# Patient Record
Sex: Female | Born: 1990 | Race: White | Hispanic: No | Marital: Single | Smoking: Never smoker
Health system: Southern US, Community
[De-identification: ages and names within clinical notes are randomized; demographics above are authoritative.]

## PROBLEM LIST (undated history)

## (undated) DIAGNOSIS — Z3483 Encounter for supervision of other normal pregnancy, third trimester: Secondary | ICD-10-CM

## (undated) DIAGNOSIS — Z9851 Tubal ligation status: Secondary | ICD-10-CM

## (undated) DIAGNOSIS — E079 Disorder of thyroid, unspecified: Secondary | ICD-10-CM

## (undated) DIAGNOSIS — E039 Hypothyroidism, unspecified: Secondary | ICD-10-CM

## (undated) DIAGNOSIS — Z3400 Encounter for supervision of normal first pregnancy, unspecified trimester: Secondary | ICD-10-CM

## (undated) HISTORY — DX: Hypothyroidism, unspecified: E03.9

## (undated) HISTORY — PX: NO PAST SURGERIES: SHX2092

## (undated) HISTORY — PX: TUBAL LIGATION: SHX77

## (undated) HISTORY — DX: Disorder of thyroid, unspecified: E07.9

---
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
## 2011-03-07 ENCOUNTER — Encounter: Payer: Self-pay | Admitting: Obstetrics and Gynecology

## 2011-03-10 ENCOUNTER — Other Ambulatory Visit (HOSPITAL_COMMUNITY): Payer: Self-pay | Admitting: Obstetrics and Gynecology

## 2011-03-10 DIAGNOSIS — Z3689 Encounter for other specified antenatal screening: Secondary | ICD-10-CM

## 2011-03-10 DIAGNOSIS — O2690 Pregnancy related conditions, unspecified, unspecified trimester: Secondary | ICD-10-CM

## 2011-03-14 ENCOUNTER — Ambulatory Visit (HOSPITAL_COMMUNITY)
Admission: RE | Admit: 2011-03-14 | Discharge: 2011-03-14 | Disposition: A | Discharge status: Home / Self Care | Payer: BC Managed Care – PPO | Source: Ambulatory Visit | Attending: Obstetrics and Gynecology | Admitting: Obstetrics and Gynecology

## 2011-03-14 ENCOUNTER — Encounter (HOSPITAL_COMMUNITY): Payer: Self-pay

## 2011-03-14 DIAGNOSIS — O4100X0 Oligohydramnios, unspecified trimester, not applicable or unspecified: Secondary | ICD-10-CM | POA: Insufficient documentation

## 2011-03-14 DIAGNOSIS — Z3689 Encounter for other specified antenatal screening: Secondary | ICD-10-CM

## 2011-03-14 DIAGNOSIS — O2690 Pregnancy related conditions, unspecified, unspecified trimester: Secondary | ICD-10-CM

## 2011-03-14 DIAGNOSIS — O358XX0 Maternal care for other (suspected) fetal abnormality and damage, not applicable or unspecified: Secondary | ICD-10-CM | POA: Insufficient documentation

## 2011-03-14 DIAGNOSIS — Z1389 Encounter for screening for other disorder: Secondary | ICD-10-CM | POA: Insufficient documentation

## 2011-03-14 DIAGNOSIS — Z363 Encounter for antenatal screening for malformations: Secondary | ICD-10-CM | POA: Insufficient documentation

## 2011-03-14 NOTE — Progress Notes (Signed)
Obstetric ultrasound completed today.  Please see report in [facility]. 

## 2011-03-28 ENCOUNTER — Ambulatory Visit (HOSPITAL_COMMUNITY)
Admission: RE | Admit: 2011-03-28 | Discharge: 2011-03-28 | Disposition: A | Discharge status: Home / Self Care | Payer: BC Managed Care – PPO | Source: Ambulatory Visit | Attending: Obstetrics and Gynecology | Admitting: Obstetrics and Gynecology

## 2011-03-28 DIAGNOSIS — O358XX0 Maternal care for other (suspected) fetal abnormality and damage, not applicable or unspecified: Secondary | ICD-10-CM | POA: Insufficient documentation

## 2011-03-28 DIAGNOSIS — O4100X0 Oligohydramnios, unspecified trimester, not applicable or unspecified: Secondary | ICD-10-CM | POA: Insufficient documentation

## 2011-03-28 DIAGNOSIS — O2690 Pregnancy related conditions, unspecified, unspecified trimester: Secondary | ICD-10-CM

## 2011-03-28 NOTE — Progress Notes (Signed)
Ultrasound in [facility].  Follow up U/S scheduled 

## 2011-04-18 ENCOUNTER — Other Ambulatory Visit (HOSPITAL_COMMUNITY): Payer: Self-pay | Admitting: Obstetrics and Gynecology

## 2011-04-18 ENCOUNTER — Ambulatory Visit (HOSPITAL_COMMUNITY)
Admission: RE | Admit: 2011-04-18 | Discharge: 2011-04-18 | Disposition: A | Discharge status: Home / Self Care | Payer: BC Managed Care – PPO | Source: Ambulatory Visit | Attending: Obstetrics and Gynecology | Admitting: Obstetrics and Gynecology

## 2011-04-18 DIAGNOSIS — O2690 Pregnancy related conditions, unspecified, unspecified trimester: Secondary | ICD-10-CM

## 2011-04-18 DIAGNOSIS — O4100X0 Oligohydramnios, unspecified trimester, not applicable or unspecified: Secondary | ICD-10-CM | POA: Insufficient documentation

## 2011-04-18 DIAGNOSIS — Z3689 Encounter for other specified antenatal screening: Secondary | ICD-10-CM | POA: Insufficient documentation

## 2011-04-23 LAB — US OB DETAIL + 14 WK

## 2011-05-09 ENCOUNTER — Ambulatory Visit (HOSPITAL_COMMUNITY)
Admission: RE | Admit: 2011-05-09 | Discharge: 2011-05-09 | Disposition: A | Discharge status: Home / Self Care | Payer: BC Managed Care – PPO | Source: Ambulatory Visit | Attending: Obstetrics and Gynecology | Admitting: Obstetrics and Gynecology

## 2011-05-09 ENCOUNTER — Other Ambulatory Visit (HOSPITAL_COMMUNITY): Payer: Self-pay | Admitting: Maternal and Fetal Medicine

## 2011-05-09 DIAGNOSIS — O4100X0 Oligohydramnios, unspecified trimester, not applicable or unspecified: Secondary | ICD-10-CM | POA: Insufficient documentation

## 2011-05-09 DIAGNOSIS — O2690 Pregnancy related conditions, unspecified, unspecified trimester: Secondary | ICD-10-CM

## 2011-05-09 DIAGNOSIS — O365990 Maternal care for other known or suspected poor fetal growth, unspecified trimester, not applicable or unspecified: Secondary | ICD-10-CM | POA: Insufficient documentation

## 2011-05-20 NOTE — L&D Delivery Note (Signed)
Delivery Note At 10:37 PM a non-viable female was delivered via Vaginal, Spontaneous Delivery (Presentation: Right Occiput Anterior).  APGAR:8 9, ; weight 6#13 .   Placenta status: Intact, Spontaneous.  Cord: 3 vessels with the following complications: None.   Anesthesia: Epidural  Episiotomy: None Lacerations: 1st degree Perineal;Periurethral Suture Repair: 3.0 vicryl rapide Est. Blood Loss (mL): 500cc  Mom to postpartum.  Baby to nursery-stable.  BOVARD,Christine Mercer 07/25/2011, 10:59 PM  B neg, RI, Br, Contra?

## 2011-05-29 ENCOUNTER — Ambulatory Visit (HOSPITAL_COMMUNITY)
Admission: RE | Admit: 2011-05-29 | Discharge: 2011-05-29 | Disposition: A | Discharge status: Home / Self Care | Payer: BC Managed Care – PPO | Source: Ambulatory Visit | Attending: Obstetrics and Gynecology | Admitting: Obstetrics and Gynecology

## 2011-05-29 DIAGNOSIS — O365990 Maternal care for other known or suspected poor fetal growth, unspecified trimester, not applicable or unspecified: Secondary | ICD-10-CM | POA: Insufficient documentation

## 2011-05-29 DIAGNOSIS — O358XX0 Maternal care for other (suspected) fetal abnormality and damage, not applicable or unspecified: Secondary | ICD-10-CM

## 2011-05-29 DIAGNOSIS — O2690 Pregnancy related conditions, unspecified, unspecified trimester: Secondary | ICD-10-CM

## 2011-05-29 DIAGNOSIS — O4100X0 Oligohydramnios, unspecified trimester, not applicable or unspecified: Secondary | ICD-10-CM | POA: Insufficient documentation

## 2011-05-29 NOTE — Progress Notes (Signed)
Ms. Santarelli was seen for ultrasound appointment today.  Please see [facility] report for details.

## 2011-06-26 ENCOUNTER — Other Ambulatory Visit (HOSPITAL_COMMUNITY): Payer: Self-pay | Admitting: Maternal and Fetal Medicine

## 2011-06-26 ENCOUNTER — Ambulatory Visit (HOSPITAL_COMMUNITY)
Admission: RE | Admit: 2011-06-26 | Discharge: 2011-06-26 | Disposition: A | Discharge status: Home / Self Care | Payer: BC Managed Care – PPO | Source: Ambulatory Visit | Attending: Obstetrics and Gynecology | Admitting: Obstetrics and Gynecology

## 2011-06-26 DIAGNOSIS — Z3689 Encounter for other specified antenatal screening: Secondary | ICD-10-CM | POA: Insufficient documentation

## 2011-06-26 DIAGNOSIS — IMO0002 Reserved for concepts with insufficient information to code with codable children: Secondary | ICD-10-CM

## 2011-06-26 DIAGNOSIS — O358XX0 Maternal care for other (suspected) fetal abnormality and damage, not applicable or unspecified: Secondary | ICD-10-CM

## 2011-06-26 DIAGNOSIS — O4100X0 Oligohydramnios, unspecified trimester, not applicable or unspecified: Secondary | ICD-10-CM | POA: Insufficient documentation

## 2011-07-10 ENCOUNTER — Ambulatory Visit (HOSPITAL_COMMUNITY)
Admission: RE | Admit: 2011-07-10 | Discharge: 2011-07-10 | Disposition: A | Discharge status: Home / Self Care | Payer: BC Managed Care – PPO | Source: Ambulatory Visit | Attending: Obstetrics and Gynecology | Admitting: Obstetrics and Gynecology

## 2011-07-10 DIAGNOSIS — IMO0002 Reserved for concepts with insufficient information to code with codable children: Secondary | ICD-10-CM

## 2011-07-10 DIAGNOSIS — O4100X0 Oligohydramnios, unspecified trimester, not applicable or unspecified: Secondary | ICD-10-CM | POA: Insufficient documentation

## 2011-07-10 NOTE — Progress Notes (Signed)
Obstetric ultrasound performed today.  Please see report in [facility].  Single living intrauterine pregnancy at 37 weeks 3 days. Appropriate fetal growth. Normal amniotic fluid volume. Reassuring interval fetal anatomy.

## 2011-07-17 ENCOUNTER — Ambulatory Visit (HOSPITAL_COMMUNITY)
Admission: RE | Admit: 2011-07-17 | Discharge: 2011-07-17 | Disposition: A | Discharge status: Home / Self Care | Payer: BC Managed Care – PPO | Source: Ambulatory Visit | Attending: Obstetrics and Gynecology | Admitting: Obstetrics and Gynecology

## 2011-07-17 DIAGNOSIS — O4100X0 Oligohydramnios, unspecified trimester, not applicable or unspecified: Secondary | ICD-10-CM

## 2011-07-17 DIAGNOSIS — Z3689 Encounter for other specified antenatal screening: Secondary | ICD-10-CM | POA: Insufficient documentation

## 2011-07-25 ENCOUNTER — Encounter (HOSPITAL_COMMUNITY): Payer: Self-pay | Admitting: Anesthesiology

## 2011-07-25 ENCOUNTER — Inpatient Hospital Stay (HOSPITAL_COMMUNITY): Payer: BC Managed Care – PPO | Admitting: Anesthesiology

## 2011-07-25 ENCOUNTER — Inpatient Hospital Stay (HOSPITAL_COMMUNITY)
Admission: AD | Admit: 2011-07-25 | Discharge: 2011-07-27 | DRG: 373 | Disposition: A | Discharge status: Home / Self Care | Payer: BC Managed Care – PPO | Source: Ambulatory Visit | Attending: Obstetrics and Gynecology | Admitting: Obstetrics and Gynecology

## 2011-07-25 ENCOUNTER — Encounter (HOSPITAL_COMMUNITY): Payer: Self-pay | Admitting: *Deleted

## 2011-07-25 DIAGNOSIS — Z22330 Carrier of Group B streptococcus: Secondary | ICD-10-CM

## 2011-07-25 DIAGNOSIS — O358XX0 Maternal care for other (suspected) fetal abnormality and damage, not applicable or unspecified: Secondary | ICD-10-CM

## 2011-07-25 DIAGNOSIS — O99892 Other specified diseases and conditions complicating childbirth: Secondary | ICD-10-CM | POA: Diagnosis present

## 2011-07-25 DIAGNOSIS — Z3400 Encounter for supervision of normal first pregnancy, unspecified trimester: Secondary | ICD-10-CM

## 2011-07-25 HISTORY — DX: Encounter for supervision of normal first pregnancy, unspecified trimester: Z34.00

## 2011-07-25 LAB — CBC
MCH: 28.8 pg (ref 26.0–34.0)
MCHC: 33 g/dL (ref 30.0–36.0)
Platelets: 257 10*3/uL (ref 150–400)

## 2011-07-25 LAB — RPR
RPR Ser Ql: NONREACTIVE
RPR: NONREACTIVE

## 2011-07-25 LAB — HIV ANTIBODY (ROUTINE TESTING W REFLEX): HIV: NONREACTIVE

## 2011-07-25 LAB — GC/CHLAMYDIA PROBE AMP, GENITAL: Gonorrhea: NEGATIVE

## 2011-07-25 LAB — ANTIBODY SCREEN: Antibody Screen: NEGATIVE

## 2011-07-25 MED ORDER — ONDANSETRON HCL 4 MG/2ML IJ SOLN: 4.0000 mg | Freq: Four times a day (QID) | INTRAMUSCULAR | Status: DC | PRN

## 2011-07-25 MED ORDER — ACETAMINOPHEN 325 MG PO TABS: 650.0000 mg | ORAL_TABLET | ORAL | Status: DC | PRN

## 2011-07-25 MED ORDER — EPHEDRINE 5 MG/ML INJ
INTRAVENOUS | Status: AC
  Filled 2011-07-25: qty 4

## 2011-07-25 MED ORDER — PHENYLEPHRINE 40 MCG/ML (10ML) SYRINGE FOR IV PUSH (FOR BLOOD PRESSURE SUPPORT): 80.0000 ug | PREFILLED_SYRINGE | INTRAVENOUS | Status: DC | PRN

## 2011-07-25 MED ORDER — EPHEDRINE 5 MG/ML INJ: 10.0000 mg | INTRAVENOUS | Status: DC | PRN

## 2011-07-25 MED ORDER — OXYTOCIN 20 UNITS IN LACTATED RINGERS INFUSION - SIMPLE: 1.0000 m[IU]/min | INTRAVENOUS | Status: DC

## 2011-07-25 MED ORDER — PHENYLEPHRINE 40 MCG/ML (10ML) SYRINGE FOR IV PUSH (FOR BLOOD PRESSURE SUPPORT)
PREFILLED_SYRINGE | INTRAVENOUS | Status: AC
  Filled 2011-07-25: qty 5

## 2011-07-25 MED ORDER — FENTANYL 2.5 MCG/ML BUPIVACAINE 1/10 % EPIDURAL INFUSION (WH - ANES)
14.0000 mL/h | INTRAMUSCULAR | Status: DC
  Administered 2011-07-25: 14 mL/h via EPIDURAL
  Filled 2011-07-25 (×2): qty 60

## 2011-07-25 MED ORDER — CITRIC ACID-SODIUM CITRATE 334-500 MG/5ML PO SOLN: 30.0000 mL | ORAL | Status: DC | PRN

## 2011-07-25 MED ORDER — FLEET ENEMA 7-19 GM/118ML RE ENEM: 1.0000 | ENEMA | RECTAL | Status: DC | PRN

## 2011-07-25 MED ORDER — PENICILLIN G POTASSIUM 5000000 UNITS IJ SOLR
2.5000 10*6.[IU] | INTRAVENOUS | Status: DC
  Filled 2011-07-25 (×4): qty 2.5

## 2011-07-25 MED ORDER — FENTANYL 2.5 MCG/ML BUPIVACAINE 1/10 % EPIDURAL INFUSION (WH - ANES)
INTRAMUSCULAR | Status: AC
  Filled 2011-07-25: qty 60

## 2011-07-25 MED ORDER — BUTORPHANOL TARTRATE 2 MG/ML IJ SOLN: 2.0000 mg | Freq: Two times a day (BID) | INTRAMUSCULAR | Status: DC | PRN

## 2011-07-25 MED ORDER — SODIUM CHLORIDE 0.9 % IV SOLN
2.0000 g | Freq: Once | INTRAVENOUS | Status: AC
  Administered 2011-07-25: 2 g via INTRAVENOUS
  Filled 2011-07-25: qty 2000

## 2011-07-25 MED ORDER — LACTATED RINGERS IV SOLN
INTRAVENOUS | Status: DC
  Administered 2011-07-25 (×2): via INTRAVENOUS

## 2011-07-25 MED ORDER — DIPHENHYDRAMINE HCL 50 MG/ML IJ SOLN: 12.5000 mg | INTRAMUSCULAR | Status: DC | PRN

## 2011-07-25 MED ORDER — LACTATED RINGERS IV SOLN
500.0000 mL | INTRAVENOUS | Status: DC | PRN
  Administered 2011-07-25: 500 mL via INTRAVENOUS

## 2011-07-25 MED ORDER — LACTATED RINGERS IV SOLN: 500.0000 mL | Freq: Once | INTRAVENOUS | Status: DC

## 2011-07-25 MED ORDER — LIDOCAINE HCL (PF) 1 % IJ SOLN
30.0000 mL | INTRAMUSCULAR | Status: DC | PRN
  Filled 2011-07-25: qty 30

## 2011-07-25 MED ORDER — FENTANYL 2.5 MCG/ML BUPIVACAINE 1/10 % EPIDURAL INFUSION (WH - ANES)
INTRAMUSCULAR | Status: DC | PRN
  Administered 2011-07-25: 14 mL/h via EPIDURAL

## 2011-07-25 MED ORDER — OXYTOCIN BOLUS FROM INFUSION
500.0000 mL | Freq: Once | INTRAVENOUS | Status: DC
  Filled 2011-07-25: qty 500
  Filled 2011-07-25: qty 1000

## 2011-07-25 MED ORDER — EPHEDRINE 5 MG/ML INJ
10.0000 mg | INTRAVENOUS | Status: AC | PRN
  Administered 2011-07-25 (×2): 10 mg via INTRAVENOUS

## 2011-07-25 MED ORDER — OXYCODONE-ACETAMINOPHEN 5-325 MG PO TABS: 1.0000 | ORAL_TABLET | ORAL | Status: DC | PRN

## 2011-07-25 MED ORDER — IBUPROFEN 600 MG PO TABS
600.0000 mg | ORAL_TABLET | Freq: Four times a day (QID) | ORAL | Status: DC | PRN
  Administered 2011-07-25: 600 mg via ORAL
  Filled 2011-07-25: qty 1

## 2011-07-25 MED ORDER — SODIUM BICARBONATE 8.4 % IV SOLN
INTRAVENOUS | Status: DC | PRN
  Administered 2011-07-25: 4 mL via EPIDURAL

## 2011-07-25 MED ORDER — PENICILLIN G POTASSIUM 5000000 UNITS IJ SOLR
2.5000 10*6.[IU] | INTRAVENOUS | Status: DC
  Administered 2011-07-25 (×2): 2.5 10*6.[IU] via INTRAVENOUS
  Filled 2011-07-25 (×4): qty 2.5

## 2011-07-25 MED ORDER — OXYTOCIN 20 UNITS IN LACTATED RINGERS INFUSION - SIMPLE: 125.0000 mL/h | Freq: Once | INTRAVENOUS | Status: DC

## 2011-07-25 MED ORDER — TERBUTALINE SULFATE 1 MG/ML IJ SOLN: 0.2500 mg | Freq: Once | INTRAMUSCULAR | Status: AC | PRN

## 2011-07-25 NOTE — Anesthesia Preprocedure Evaluation (Signed)

## 2011-07-25 NOTE — H&P (Signed)
Christine Mercer is a 21 y.o. female presenting for onset of labor.  SVE in office 5cm.  Pregnancy complicated by low AFI throughout, followed by MFM and [place].  Pt declined genetic screening.  Also GBBS.  +FM, no LOF, no VB, ctx increasing in intensity and frequency.  . Maternal Medical History:  Reason for admission: Reason for admission: contractions.  Contractions: Onset was yesterday.   Frequency: regular.   Perceived severity is moderate.    Fetal activity: Perceived fetal activity is normal.      OB History    Grav Para Term Preterm Abortions TAB SAB Ect Mult Living   1 0 0 0 0 0 0 0 0 0     G1 present, no pap, no STDs Past Medical History  Diagnosis Date  . No pertinent past medical history   . Normal pregnancy, first 07/25/2011   Past Surgical History  Procedure Date  . No past surgeries    Family History: DM, HTN Social History:  reports that she has never smoked. She does not have any smokeless tobacco history on file. She reports that she does not drink alcohol or use illicit drugs.in college  Review of Systems  Constitutional: Negative.   HENT: Negative.   Eyes: Negative.   Respiratory: Negative.   Cardiovascular: Negative.   Gastrointestinal: Negative.   Genitourinary: Negative.   Musculoskeletal: Negative.   Skin: Negative.   Neurological: Negative.   Psychiatric/Behavioral: Negative.       Blood pressure 125/72, pulse 104, last menstrual period 09/20/2010. Maternal Exam:  Abdomen: Patient reports no abdominal tenderness. Fundal height is small for dates.   Estimated fetal weight is 6.   Fetal presentation: vertex     Physical Exam  Constitutional: She is oriented to person, place, and time. She appears well-developed and well-nourished.  HENT:  Head: Normocephalic and atraumatic.  Eyes: Pupils are equal, round, and reactive to light.  Neck: Normal range of motion. Neck supple. No thyromegaly present.  Cardiovascular: Normal rate and regular rhythm.     Respiratory: Effort normal and breath sounds normal.  GI: Soft. Bowel sounds are normal.  Musculoskeletal: Normal range of motion.  Neurological: She is alert and oriented to person, place, and time.  Skin: Skin is warm and dry.  Psychiatric: She has a normal mood and affect. Her behavior is normal.    Prenatal labs: ABO, Rh: B/Negative/-- (03/08 0000) Antibody: Negative (03/08 0000) Rubella:  Immune RPR:   NR HBsAg: Negative (03/08 0000)  HIV: Non-reactive (03/08 0000)  GBS: Positive (03/08 0000)  Hgb13.2, Plt 267K, Rhogam 12/18, genetic screening declined, gc neg, chl neg  Dated by 8 week [place] Anat WNL, low AFI, followed by MFM, posterior placenta Assessment/Plan: 20yo G1P0 at 39+ with active labor, expect SVD, epidural prn, AROM after PCN, PCN for prophylaxis, pitocin prn   BOVARD,Emile Ringgenberg 07/25/2011, 1:48 PM

## 2011-07-25 NOTE — Progress Notes (Signed)
Christine Mercer is a 21 y.o. G1P0000 at [redacted]w[redacted]d admitted for active labor  Subjective: comf with epidural  Objective: BP 118/61  Pulse 114  Temp(Src) 97.5 F (36.4 C) (Oral)  Resp 20  Ht 5\' 5"  (1.651 m)  Wt 81.647 kg (180 lb)  BMI 29.95 kg/m2  SpO2 98%  LMP 09/20/2010      FHT:  FHR: 140's bpm, variability: moderate,  accelerations:  Present,  decelerations:  Absent UC:   regular, every 2-3 minutes SVE:   Dilation: 7.5 Effacement (%): 90 Station: 0 Exam by:: Dr Ellyn Hack  Labs: Lab Results  Component Value Date   WBC 17.5* 07/25/2011   HGB 12.4 07/25/2011   HCT 37.6 07/25/2011   MCV 87.4 07/25/2011   PLT 257 07/25/2011    Assessment / Plan: Spontaneous labor, progressing normally  Labor: Progressing normally Preeclampsia:  no signs or symptoms of toxicity Fetal Wellbeing:  Category I Pain Control:  Epidural I/D:  n/a Anticipated MOD:  NSVD  BOVARD,Adelei Scobey 07/25/2011, 6:43 PM

## 2011-07-25 NOTE — Anesthesia Procedure Notes (Signed)

## 2011-07-26 LAB — CBC
Hemoglobin: 9.9 g/dL — ABNORMAL LOW (ref 12.0–15.0)
Platelets: 263 10*3/uL (ref 150–400)
RBC: 3.46 MIL/uL — ABNORMAL LOW (ref 3.87–5.11)

## 2011-07-26 LAB — ABO/RH: ABO/RH(D): B NEG

## 2011-07-26 MED ORDER — PRENATAL MULTIVITAMIN CH
1.0000 | ORAL_TABLET | Freq: Every day | ORAL | Status: DC
  Administered 2011-07-26 – 2011-07-27 (×2): 1 via ORAL
  Filled 2011-07-26: qty 1

## 2011-07-26 MED ORDER — WITCH HAZEL-GLYCERIN EX PADS: 1.0000 "application " | MEDICATED_PAD | CUTANEOUS | Status: DC | PRN

## 2011-07-26 MED ORDER — DIPHENHYDRAMINE HCL 25 MG PO CAPS: 25.0000 mg | ORAL_CAPSULE | Freq: Four times a day (QID) | ORAL | Status: DC | PRN

## 2011-07-26 MED ORDER — DIBUCAINE 1 % RE OINT: 1.0000 "application " | TOPICAL_OINTMENT | RECTAL | Status: DC | PRN

## 2011-07-26 MED ORDER — LANOLIN HYDROUS EX OINT: TOPICAL_OINTMENT | CUTANEOUS | Status: DC | PRN

## 2011-07-26 MED ORDER — SIMETHICONE 80 MG PO CHEW: 80.0000 mg | CHEWABLE_TABLET | ORAL | Status: DC | PRN

## 2011-07-26 MED ORDER — OXYCODONE-ACETAMINOPHEN 5-325 MG PO TABS
1.0000 | ORAL_TABLET | ORAL | Status: DC | PRN
  Administered 2011-07-26 – 2011-07-27 (×4): 1 via ORAL
  Filled 2011-07-26 (×4): qty 1

## 2011-07-26 MED ORDER — BENZOCAINE-MENTHOL 20-0.5 % EX AERO: 1.0000 "application " | INHALATION_SPRAY | CUTANEOUS | Status: DC | PRN

## 2011-07-26 MED ORDER — ONDANSETRON HCL 4 MG PO TABS: 4.0000 mg | ORAL_TABLET | ORAL | Status: DC | PRN

## 2011-07-26 MED ORDER — TETANUS-DIPHTH-ACELL PERTUSSIS 5-2.5-18.5 LF-MCG/0.5 IM SUSP: 0.5000 mL | Freq: Once | INTRAMUSCULAR | Status: DC

## 2011-07-26 MED ORDER — LACTATED RINGERS IV SOLN: INTRAVENOUS | Status: DC

## 2011-07-26 MED ORDER — ZOLPIDEM TARTRATE 5 MG PO TABS: 5.0000 mg | ORAL_TABLET | Freq: Every evening | ORAL | Status: DC | PRN

## 2011-07-26 MED ORDER — PRENATAL MULTIVITAMIN CH: 1.0000 | ORAL_TABLET | Freq: Every day | ORAL | Status: DC

## 2011-07-26 MED ORDER — RHO D IMMUNE GLOBULIN 1500 UNIT/2ML IJ SOLN
300.0000 ug | Freq: Once | INTRAMUSCULAR | Status: AC
  Administered 2011-07-26: 300 ug via INTRAMUSCULAR
  Filled 2011-07-26: qty 2

## 2011-07-26 MED ORDER — SENNOSIDES-DOCUSATE SODIUM 8.6-50 MG PO TABS
2.0000 | ORAL_TABLET | Freq: Every day | ORAL | Status: DC
  Administered 2011-07-26: 2 via ORAL

## 2011-07-26 MED ORDER — ONDANSETRON HCL 4 MG/2ML IJ SOLN: 4.0000 mg | INTRAMUSCULAR | Status: DC | PRN

## 2011-07-26 MED ORDER — IBUPROFEN 600 MG PO TABS
600.0000 mg | ORAL_TABLET | Freq: Four times a day (QID) | ORAL | Status: DC
  Administered 2011-07-26 – 2011-07-27 (×6): 600 mg via ORAL
  Filled 2011-07-26 (×6): qty 1

## 2011-07-26 NOTE — Progress Notes (Signed)
Post Partum Day 1 Subjective: no complaints, voiding, tolerating PO and nl lochia, pain controlled  Objective: Blood pressure 117/78, pulse 90, temperature 97.7 F (36.5 C), temperature source Oral, resp. rate 21, height 5\' 5"  (1.651 m), weight 81.647 kg (180 lb), last menstrual period 09/20/2010, SpO2 98.00%, unknown if currently breastfeeding.  Physical Exam:  General: alert and no distress Lochia: appropriate Uterine Fundus: firm   Basename 07/26/11 0520 07/25/11 1315  HGB 9.9* 12.4  HCT 30.5* 37.6    Assessment/Plan: Plan for discharge tomorrow   LOS: 1 day   BOVARD,Jaedyn Lard 07/26/2011, 9:31 AM

## 2011-07-26 NOTE — Anesthesia Postprocedure Evaluation (Signed)
  Anesthesia Post-op Note  Patient: Christine Mercer  Procedure(s) Performed: * No procedures listed *  Patient Location: PACU and Mother/Baby  Anesthesia Type: Epidural  Level of Consciousness: awake, alert  and oriented  Airway and Oxygen Therapy: Patient Spontanous Breathing  Post-op Pain: none  Post-op Assessment: Post-op Vital signs reviewed and Patient's Cardiovascular Status Stable  Post-op Vital Signs: Reviewed and stable  Complications: No apparent anesthesia complications

## 2011-07-26 NOTE — Progress Notes (Signed)
Transferred to 145 via wheelchair with infant and support persons

## 2011-07-27 LAB — RH IG WORKUP (INCLUDES ABO/RH)
Antibody Screen: POSITIVE
DAT, IgG: NEGATIVE
Gestational Age(Wks): 39

## 2011-07-27 MED ORDER — PRENATAL MULTIVITAMIN CH: 1.0000 | ORAL_TABLET | Freq: Every day | ORAL | Status: DC

## 2011-07-27 MED ORDER — OXYCODONE-ACETAMINOPHEN 5-325 MG PO TABS: 1.0000 | ORAL_TABLET | Freq: Four times a day (QID) | ORAL | Status: AC | PRN

## 2011-07-27 MED ORDER — IBUPROFEN 800 MG PO TABS: 800.0000 mg | ORAL_TABLET | Freq: Three times a day (TID) | ORAL | Status: AC | PRN

## 2011-07-27 NOTE — Discharge Summary (Signed)
Obstetric Discharge Summary Reason for Admission: onset of labor Prenatal Procedures: none Intrapartum Procedures: spontaneous vaginal delivery Postpartum Procedures: Rho(D) Ig Complications-Operative and Postpartum: 2nd degree perineal laceration and labial/periclitoral laceration Hemoglobin  Date Value Range Status  07/26/2011 9.9* 12.0-15.0 (g/dL) Final     REPEATED TO VERIFY     DELTA CHECK NOTED     HCT  Date Value Range Status  07/26/2011 30.5* 36.0-46.0 (%) Final    Discharge Diagnoses: Term Pregnancy-delivered  Discharge Information: Date: 07/27/2011 Activity: pelvic rest Diet: routine Medications: PNV, Ibuprofen and Percocet Condition: stable Instructions: refer to practice specific booklet Discharge to: home Follow-up Information    Follow up with BOVARD,Alycea Segoviano, MD. Schedule an appointment as soon as possible for a visit in 6 weeks.   Contact information:   [address] [facility] [address] [place] [address] [phone]          Newborn Data: Live born female  Birth Weight: 6 lb 13.5 oz (3105 g) APGAR: 8,   Home with mother.  BOVARD,Tekeshia Klahr 07/27/2011, 12:09 PM

## 2011-07-27 NOTE — Progress Notes (Signed)
Post Partum Day 2 Subjective: no complaints, up ad lib, voiding, tolerating PO and nl lochia, pain controlled  Objective: Blood pressure 119/72, pulse 98, temperature 98.2 F (36.8 C), temperature source Oral, resp. rate 18, height 5\' 5"  (1.651 m), weight 81.647 kg (180 lb), last menstrual period 09/20/2010, SpO2 98.00%, unknown if currently breastfeeding.  Physical Exam:  General: alert and no distress Lochia: appropriate Uterine Fundus: firm   Basename 07/26/11 0520 07/25/11 1315  HGB 9.9* 12.4  HCT 30.5* 37.6    Assessment/Plan: Discharge home and Breastfeeding   LOS: 2 days   BOVARD,Eva Vallee 07/27/2011, 12:02 PM

## 2011-07-30 ENCOUNTER — Inpatient Hospital Stay (HOSPITAL_COMMUNITY): Admit: 2011-07-30 | Payer: BC Managed Care – PPO

## 2012-07-28 IMAGING — US US OB FOLLOW-UP
1 series · 14 of 28 positions shown · non-contrast
Comparison: none

[Series 1: us ob follow-up · 0.20mm/px · 14 of 60 slices shown]
[im 3/60]
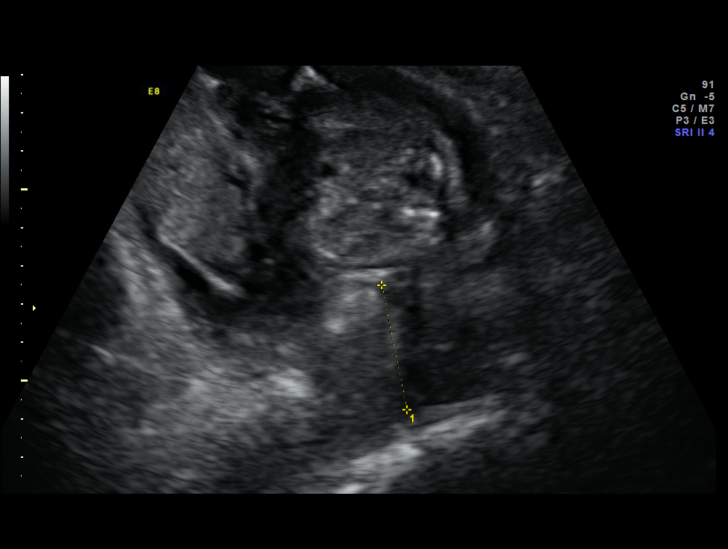
[im 7/60]
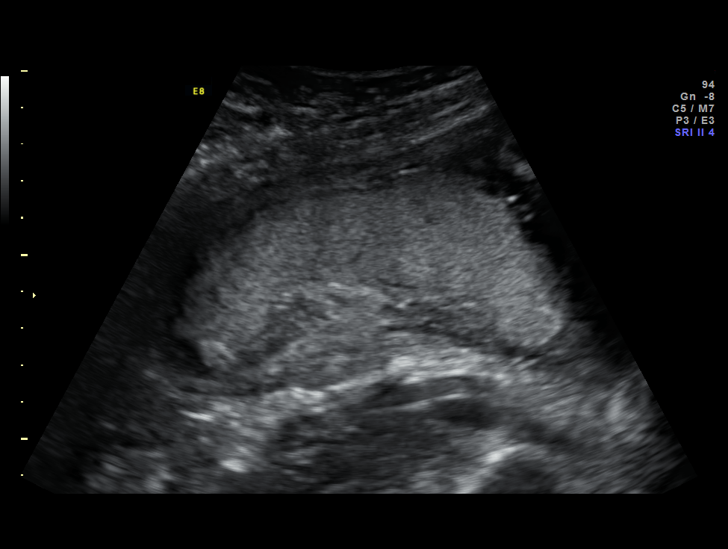
[im 11/60]
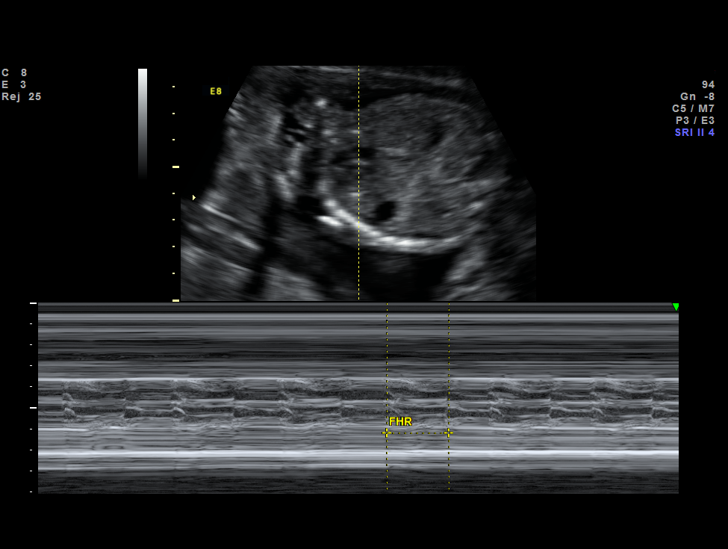
[im 16/60]
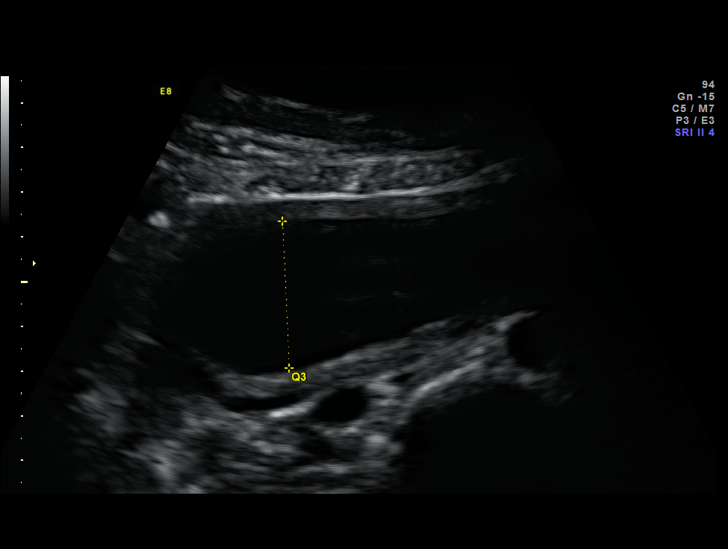
[im 20/60]
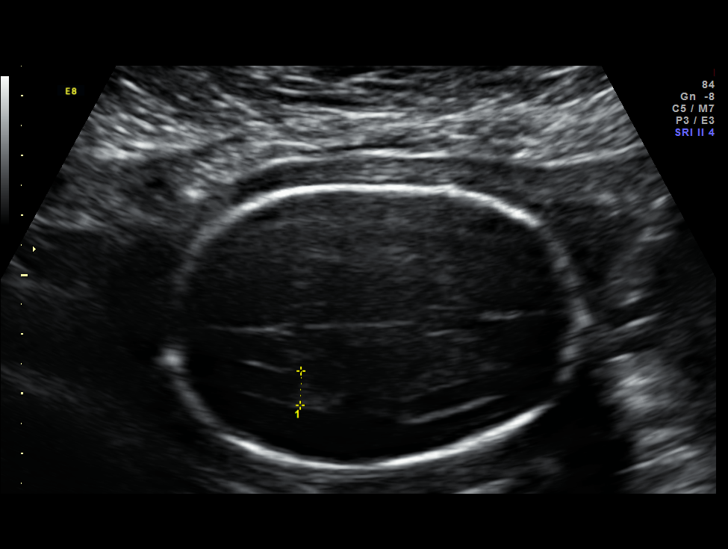
[im 25/60]
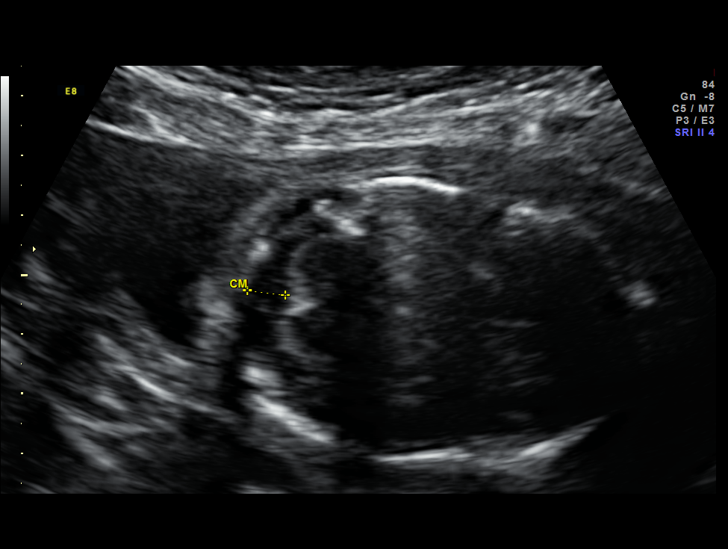
[im 29/60]
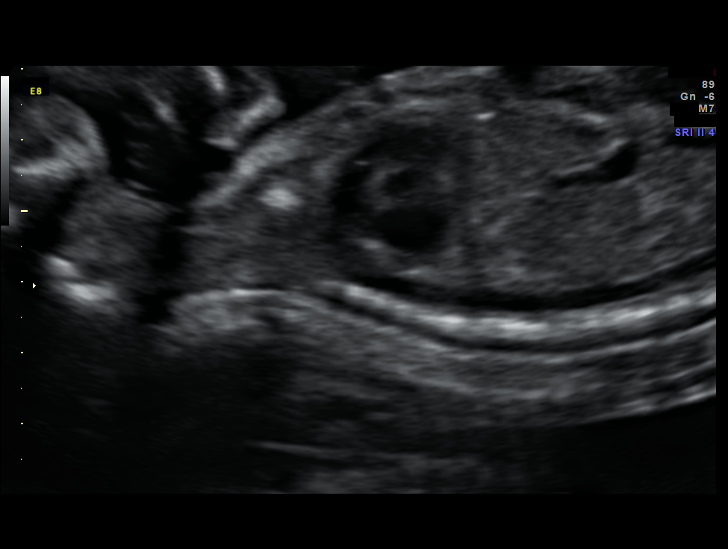
[im 33/60]
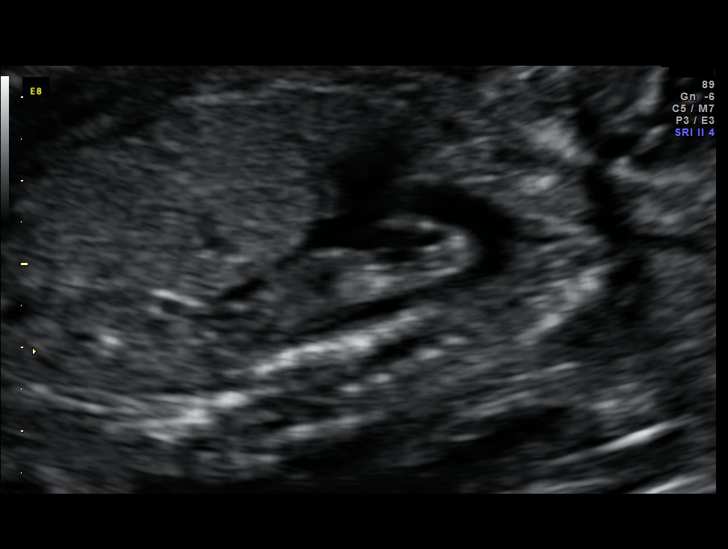
[im 38/60]
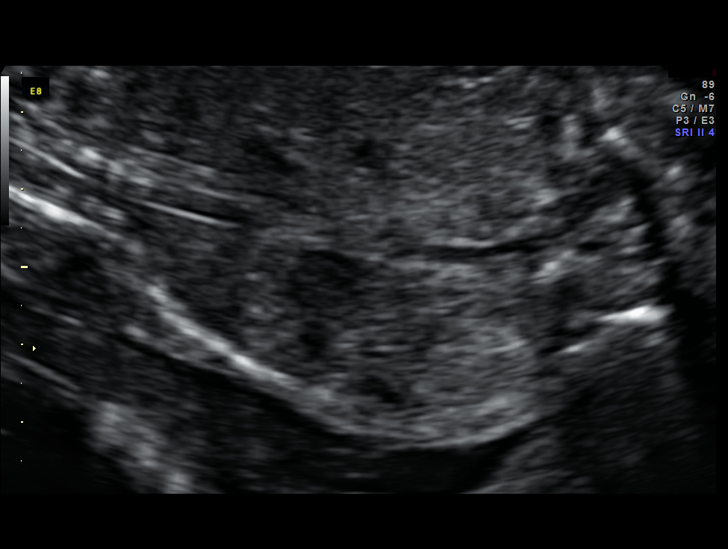
[im 42/60]
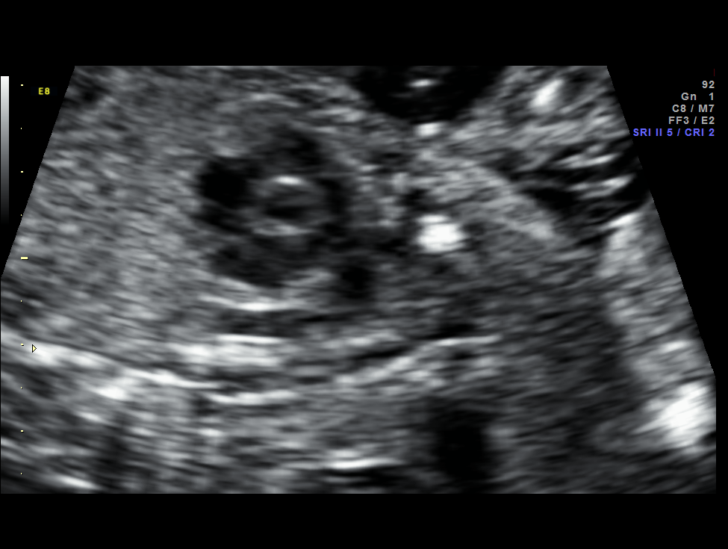
[im 46/60]
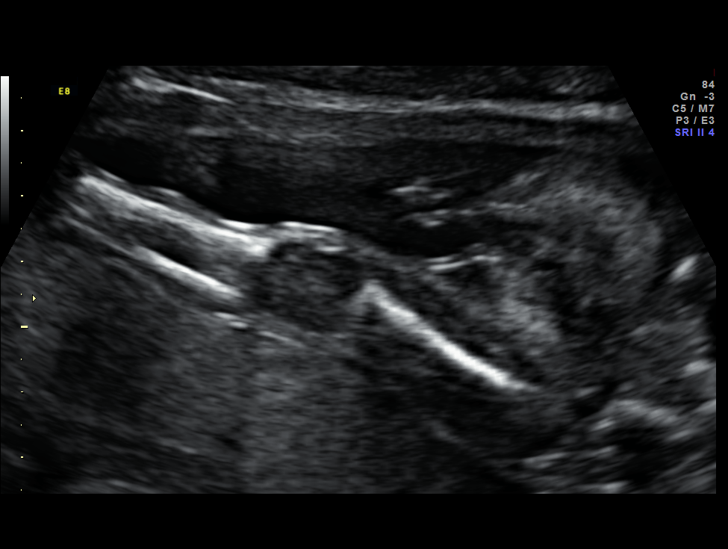
[im 51/60]
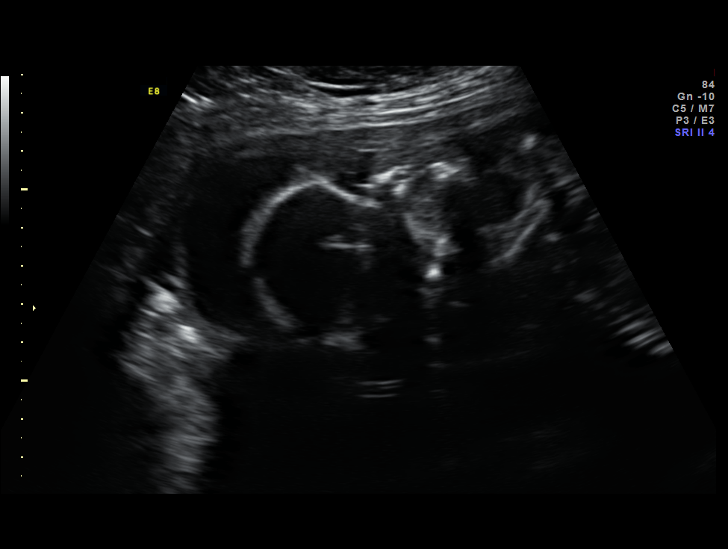
[im 55/60]
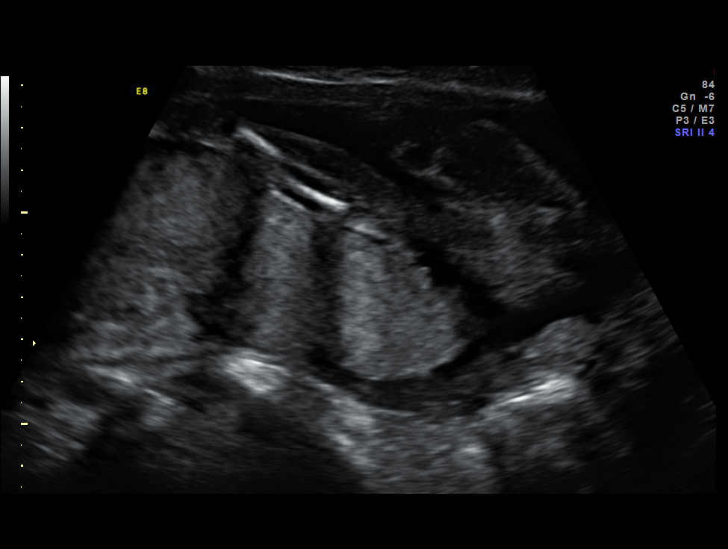
[im 60/60]
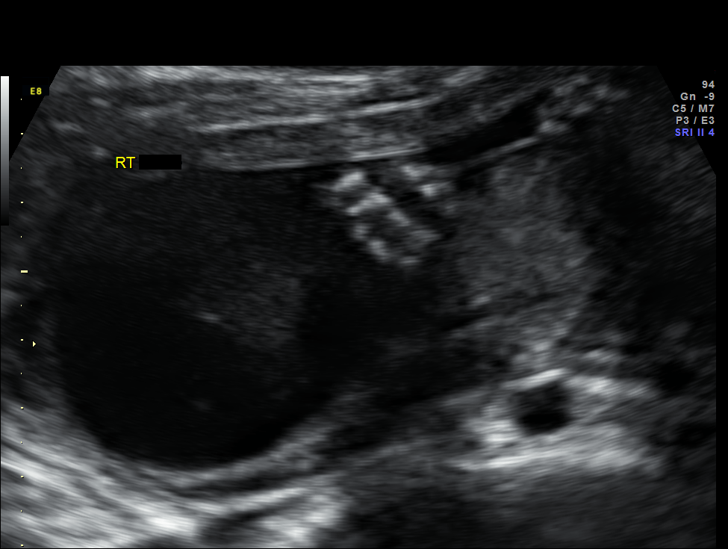

[14 of 28 positions shown; findings below may reference images not displayed]

Canned report from images found in remote index.

Refer to host system for actual result text.

## 2012-10-27 ENCOUNTER — Emergency Department (HOSPITAL_COMMUNITY)
Admission: EM | Admit: 2012-10-27 | Discharge: 2012-10-27 | Discharge status: Against Medical Advice | Payer: BC Managed Care – PPO | Attending: Emergency Medicine | Admitting: Emergency Medicine

## 2012-10-27 DIAGNOSIS — Y939 Activity, unspecified: Secondary | ICD-10-CM | POA: Insufficient documentation

## 2012-10-27 DIAGNOSIS — W108XXA Fall (on) (from) other stairs and steps, initial encounter: Secondary | ICD-10-CM | POA: Insufficient documentation

## 2012-10-27 DIAGNOSIS — R110 Nausea: Secondary | ICD-10-CM | POA: Insufficient documentation

## 2012-10-27 DIAGNOSIS — Y9289 Other specified places as the place of occurrence of the external cause: Secondary | ICD-10-CM | POA: Insufficient documentation

## 2012-10-27 DIAGNOSIS — S0181XA Laceration without foreign body of other part of head, initial encounter: Secondary | ICD-10-CM

## 2012-10-27 DIAGNOSIS — W109XXA Fall (on) (from) unspecified stairs and steps, initial encounter: Secondary | ICD-10-CM

## 2012-10-27 DIAGNOSIS — S0180XA Unspecified open wound of other part of head, initial encounter: Secondary | ICD-10-CM | POA: Insufficient documentation

## 2012-10-27 NOTE — ED Provider Notes (Signed)
History     CSN: [id number]  Arrival date & time 10/27/12  0143   First MD Initiated Contact with Patient 10/27/12 [phone]     Chief complaint: Fall, facial laceration  (Consider location/radiation/quality/duration/timing/severity/associated sxs/prior treatment) The history is provided by the patient.   22 year old female fell going up some steps and hit her face against a step. The next thing she remembers, she was on the floor in the kitchen with an ice bag over her eye. She suffered lacerations above the left eye. Her boyfriend states that she was unconscious for approximately 45 seconds. She has had some mild nausea but no vomiting. She denies dizziness or incoordination. She is up-to-date on tetanus immunizations. She denies other injury. Pain is moderate and she rates it at 4/10.  Past Medical History  Diagnosis Date  . No pertinent past medical history   . Normal pregnancy, first 07/25/2011  . SVD (spontaneous vaginal delivery) 07/25/2011    Past Surgical History  Procedure Laterality Date  . No past surgeries      No family history on file.  History  Substance Use Topics  . Smoking status: Never Smoker   . Smokeless tobacco: Never Used  . Alcohol Use: No    OB History   Grav Para Term Preterm Abortions TAB SAB Ect Mult Living   2 1 1  0 0 0 0 0 0 1      Review of Systems  All other systems reviewed and are negative.    Allergies  Review of patient's allergies indicates no known allergies.  Home Medications  No current outpatient prescriptions on file.  Vital signs are recorded on downtime forms - normal  Physical Exam  Nursing note and vitals reviewed.  22 year old female, resting comfortably and in no acute distress. Vital signs are normal. Oxygen saturation is 98%, which is normal. Head is normocephalic. PERRLA, EOMI. Oropharynx is clear. There are 2 parallel lacerations just inferior to the left upper eyebrow with slight gaping. There is periorbital  ecchymosis but no orbital step off. No other facial or head injuries seen. Neck is nontender without adenopathy or JVD. Back is nontender and there is no CVA tenderness. Lungs are clear without rales, wheezes, or rhonchi. Chest is nontender. Heart has regular rate and rhythm without murmur. Abdomen is soft, flat, nontender without masses or hepatosplenomegaly and peristalsis is normoactive. Extremities have no cyanosis or edema, full range of motion is present. Skin is warm and dry without rash. Neurologic: Mental status is normal, cranial nerves are intact, there are no motor or sensory deficits.  ED Course  Procedures (including critical care time)   1. Fall on stairs, initial encounter   2. Laceration of face, initial encounter       MDM  Fall without loss of consciousness and facial injury. Head CT has been ordered. Laceration is repaired by Sharen Hones NP.  Patient refused a CT scan stating she had had one several months ago. Rationale for her CT scan was explained to her as well as her significant other. Risk of undetected bleeding with potential permanent brain damage was explained and patient expressed understanding and still stated she did not wish to have the CT scan done. Although she has had a head injury today, she was alert, oriented and all cognitive processes seem to be functioning well. She was felt to be competent to make the decision to refuse the scan and leave AGAINST MEDICAL ADVICE. She was given head injury precautions and  told to return should she change her mind about getting CT scan.        Dione Booze, MD 10/27/12 [phone]

## 2012-10-27 NOTE — ED Notes (Signed)
MD Preston Fleeting at bedside, aware that patient refused head CT

## 2012-10-27 NOTE — ED Provider Notes (Signed)
Medical screening examination/treatment/procedure(s) were conducted as a shared visit with non-physician practitioner(s) and myself. Mid levels only involvement was with laceration repair. I personally evaluated the patient during the encounter   Dione Booze, MD 10/27/12 [phone]

## 2012-10-27 NOTE — ED Provider Notes (Signed)
  Physical Exam  There were no vitals taken for this visit.  Physical Exam Asked to suture ED Course  LACERATION REPAIR Date/Time: 10/27/2012 4:51 AM Performed by: Arman Filter Authorized by: Arman Filter Consent: Verbal consent obtained. Risks and benefits: risks, benefits and alternatives were discussed Consent given by: patient Patient understanding: patient states understanding of the procedure being performed Patient identity confirmed: verbally with patient Time out: Immediately prior to procedure a "time out" was called to verify the correct patient, procedure, equipment, support staff and site/side marked as required. Body area: head/neck Location details: left eyebrow Laceration length: 4 cm Foreign bodies: unknown Tendon involvement: none Nerve involvement: none Vascular damage: no Anesthesia: local infiltration Local anesthetic: lidocaine 1% with epinephrine Anesthetic total: 1.5 ml Patient sedated: no Preparation: Patient was prepped and draped in the usual sterile fashion. Amount of cleaning: standard Debridement: none Degree of undermining: none Skin closure: 6-0 Prolene Number of sutures: 6 Technique: simple Approximation: close Approximation difficulty: simple Dressing: antibiotic ointment Patient tolerance: Patient tolerated the procedure well with no immediate complications.    MDM Sutured laceration       Arman Filter, NP 10/27/12 [phone]

## 2013-04-18 LAB — OB RESULTS CONSOLE RUBELLA ANTIBODY, IGM: Rubella: IMMUNE

## 2013-04-18 LAB — OB RESULTS CONSOLE RPR: RPR: NONREACTIVE

## 2013-04-18 LAB — OB RESULTS CONSOLE HIV ANTIBODY (ROUTINE TESTING): HIV: NONREACTIVE

## 2013-04-18 LAB — OB RESULTS CONSOLE HEPATITIS B SURFACE ANTIGEN: HEP B S AG: NEGATIVE

## 2013-07-01 LAB — OB RESULTS CONSOLE ABO/RH: RH TYPE: NEGATIVE

## 2013-10-17 LAB — OB RESULTS CONSOLE GBS: GBS: POSITIVE

## 2013-10-17 LAB — OB RESULTS CONSOLE GC/CHLAMYDIA
Chlamydia: NEGATIVE
Gonorrhea: NEGATIVE

## 2013-11-20 ENCOUNTER — Inpatient Hospital Stay (HOSPITAL_COMMUNITY)
Admission: AD | Admit: 2013-11-20 | Discharge: 2013-11-20 | Disposition: A | Discharge status: Home / Self Care | Payer: Medicaid Other | Source: Ambulatory Visit | Attending: Obstetrics and Gynecology | Admitting: Obstetrics and Gynecology

## 2013-11-20 ENCOUNTER — Encounter (HOSPITAL_COMMUNITY): Payer: Self-pay | Admitting: *Deleted

## 2013-11-20 DIAGNOSIS — O479 False labor, unspecified: Secondary | ICD-10-CM | POA: Insufficient documentation

## 2013-11-20 NOTE — MAU Note (Signed)
Pt presents to MAU with complaints of contractions that started around 3 this morning. Denies any vaginal bleeding or LOF

## 2013-11-20 NOTE — MAU Note (Signed)
23 yo, G2P1 at 3574w1d, presents to MAU with c/o contractions every 5 minutes since 0300. Denies LOF, VB. Reports +FM.

## 2013-11-21 ENCOUNTER — Encounter (HOSPITAL_COMMUNITY): Payer: Self-pay | Admitting: *Deleted

## 2013-11-21 ENCOUNTER — Inpatient Hospital Stay (HOSPITAL_COMMUNITY)
Admission: AD | Admit: 2013-11-21 | Discharge: 2013-11-23 | DRG: 775 | Disposition: A | Discharge status: Home / Self Care | Payer: Medicaid Other | Source: Ambulatory Visit | Attending: Obstetrics and Gynecology | Admitting: Obstetrics and Gynecology

## 2013-11-21 ENCOUNTER — Encounter (HOSPITAL_COMMUNITY): Payer: Medicaid Other | Admitting: Anesthesiology

## 2013-11-21 ENCOUNTER — Inpatient Hospital Stay (HOSPITAL_COMMUNITY): Payer: Medicaid Other | Admitting: Anesthesiology

## 2013-11-21 DIAGNOSIS — O99892 Other specified diseases and conditions complicating childbirth: Secondary | ICD-10-CM | POA: Diagnosis present

## 2013-11-21 DIAGNOSIS — K219 Gastro-esophageal reflux disease without esophagitis: Secondary | ICD-10-CM | POA: Diagnosis present

## 2013-11-21 DIAGNOSIS — Z833 Family history of diabetes mellitus: Secondary | ICD-10-CM

## 2013-11-21 DIAGNOSIS — Z3493 Encounter for supervision of normal pregnancy, unspecified, third trimester: Secondary | ICD-10-CM

## 2013-11-21 DIAGNOSIS — Z22330 Carrier of Group B streptococcus: Secondary | ICD-10-CM

## 2013-11-21 DIAGNOSIS — O479 False labor, unspecified: Secondary | ICD-10-CM | POA: Diagnosis present

## 2013-11-21 DIAGNOSIS — Z3490 Encounter for supervision of normal pregnancy, unspecified, unspecified trimester: Secondary | ICD-10-CM

## 2013-11-21 DIAGNOSIS — O9989 Other specified diseases and conditions complicating pregnancy, childbirth and the puerperium: Principal | ICD-10-CM

## 2013-11-21 LAB — CBC
HCT: 38.8 % (ref 36.0–46.0)
Hemoglobin: 13 g/dL (ref 12.0–15.0)
MCH: 29.8 pg (ref 26.0–34.0)
MCHC: 33.5 g/dL (ref 30.0–36.0)
MCV: 89 fL (ref 78.0–100.0)
PLATELETS: 230 10*3/uL (ref 150–400)
RBC: 4.36 MIL/uL (ref 3.87–5.11)
RDW: 14.7 % (ref 11.5–15.5)
WBC: 16.8 10*3/uL — ABNORMAL HIGH (ref 4.0–10.5)

## 2013-11-21 LAB — SAMPLE TO BLOOD BANK

## 2013-11-21 LAB — RPR

## 2013-11-21 MED ORDER — CALCIUM CARBONATE ANTACID 500 MG PO CHEW: 1.0000 | CHEWABLE_TABLET | Freq: Two times a day (BID) | ORAL | Status: DC | PRN

## 2013-11-21 MED ORDER — SIMETHICONE 80 MG PO CHEW: 80.0000 mg | CHEWABLE_TABLET | ORAL | Status: DC | PRN

## 2013-11-21 MED ORDER — MEASLES, MUMPS & RUBELLA VAC ~~LOC~~ INJ
0.5000 mL | INJECTION | Freq: Once | SUBCUTANEOUS | Status: DC
  Filled 2013-11-21: qty 0.5

## 2013-11-21 MED ORDER — LACTATED RINGERS IV SOLN: INTRAVENOUS | Status: AC

## 2013-11-21 MED ORDER — LACTATED RINGERS IV SOLN: 500.0000 mL | INTRAVENOUS | Status: DC | PRN

## 2013-11-21 MED ORDER — OXYTOCIN BOLUS FROM INFUSION: 500.0000 mL | INTRAVENOUS | Status: DC

## 2013-11-21 MED ORDER — DIBUCAINE 1 % RE OINT: 1.0000 "application " | TOPICAL_OINTMENT | RECTAL | Status: DC | PRN

## 2013-11-21 MED ORDER — PHENYLEPHRINE 40 MCG/ML (10ML) SYRINGE FOR IV PUSH (FOR BLOOD PRESSURE SUPPORT)
80.0000 ug | PREFILLED_SYRINGE | INTRAVENOUS | Status: DC | PRN
  Administered 2013-11-21: 80 ug via INTRAVENOUS
  Filled 2013-11-21: qty 2

## 2013-11-21 MED ORDER — OXYCODONE-ACETAMINOPHEN 5-325 MG PO TABS
1.0000 | ORAL_TABLET | ORAL | Status: DC | PRN
  Administered 2013-11-21: 1 via ORAL
  Filled 2013-11-21: qty 1

## 2013-11-21 MED ORDER — PENICILLIN G POTASSIUM 5000000 UNITS IJ SOLR
5.0000 10*6.[IU] | Freq: Once | INTRAVENOUS | Status: AC
  Administered 2013-11-21: 5 10*6.[IU] via INTRAVENOUS
  Filled 2013-11-21: qty 5

## 2013-11-21 MED ORDER — ONDANSETRON HCL 4 MG PO TABS: 4.0000 mg | ORAL_TABLET | ORAL | Status: DC | PRN

## 2013-11-21 MED ORDER — FENTANYL 2.5 MCG/ML BUPIVACAINE 1/10 % EPIDURAL INFUSION (WH - ANES)
14.0000 mL/h | INTRAMUSCULAR | Status: DC | PRN
  Filled 2013-11-21: qty 125

## 2013-11-21 MED ORDER — LIDOCAINE HCL (PF) 1 % IJ SOLN
INTRAMUSCULAR | Status: DC | PRN
  Administered 2013-11-21 (×2): 4 mL

## 2013-11-21 MED ORDER — PENICILLIN G POTASSIUM 5000000 UNITS IJ SOLR
2.5000 10*6.[IU] | INTRAVENOUS | Status: DC
  Filled 2013-11-21 (×4): qty 2.5

## 2013-11-21 MED ORDER — ONDANSETRON HCL 4 MG/2ML IJ SOLN: 4.0000 mg | Freq: Four times a day (QID) | INTRAMUSCULAR | Status: DC | PRN

## 2013-11-21 MED ORDER — LANOLIN HYDROUS EX OINT: TOPICAL_OINTMENT | CUTANEOUS | Status: DC | PRN

## 2013-11-21 MED ORDER — EPHEDRINE 5 MG/ML INJ
10.0000 mg | INTRAVENOUS | Status: DC | PRN
  Administered 2013-11-21: 10 mg via INTRAVENOUS
  Filled 2013-11-21: qty 2

## 2013-11-21 MED ORDER — LACTATED RINGERS IV SOLN: 500.0000 mL | Freq: Once | INTRAVENOUS | Status: DC

## 2013-11-21 MED ORDER — OXYTOCIN 40 UNITS IN LACTATED RINGERS INFUSION - SIMPLE MED: 62.5000 mL/h | INTRAVENOUS | Status: AC | PRN

## 2013-11-21 MED ORDER — FLEET ENEMA 7-19 GM/118ML RE ENEM: 1.0000 | ENEMA | RECTAL | Status: DC | PRN

## 2013-11-21 MED ORDER — FENTANYL 2.5 MCG/ML BUPIVACAINE 1/10 % EPIDURAL INFUSION (WH - ANES)
INTRAMUSCULAR | Status: DC | PRN
  Administered 2013-11-21: 14 mL/h via EPIDURAL

## 2013-11-21 MED ORDER — ACETAMINOPHEN 325 MG PO TABS: 650.0000 mg | ORAL_TABLET | ORAL | Status: DC | PRN

## 2013-11-21 MED ORDER — EPHEDRINE 5 MG/ML INJ
10.0000 mg | INTRAVENOUS | Status: DC | PRN
  Filled 2013-11-21: qty 2
  Filled 2013-11-21: qty 4

## 2013-11-21 MED ORDER — DIPHENHYDRAMINE HCL 50 MG/ML IJ SOLN: 12.5000 mg | INTRAMUSCULAR | Status: DC | PRN

## 2013-11-21 MED ORDER — LIDOCAINE HCL (PF) 1 % IJ SOLN
30.0000 mL | INTRAMUSCULAR | Status: DC | PRN
  Filled 2013-11-21: qty 30

## 2013-11-21 MED ORDER — IBUPROFEN 600 MG PO TABS
600.0000 mg | ORAL_TABLET | Freq: Four times a day (QID) | ORAL | Status: DC
  Administered 2013-11-21 – 2013-11-23 (×7): 600 mg via ORAL
  Filled 2013-11-21 (×7): qty 1

## 2013-11-21 MED ORDER — WITCH HAZEL-GLYCERIN EX PADS
1.0000 | MEDICATED_PAD | CUTANEOUS | Status: DC | PRN
Start: 2013-11-21 — End: 2013-11-23

## 2013-11-21 MED ORDER — PRENATAL MULTIVITAMIN CH: 1.0000 | ORAL_TABLET | Freq: Every day | ORAL | Status: DC

## 2013-11-21 MED ORDER — PRENATAL MULTIVITAMIN CH
1.0000 | ORAL_TABLET | Freq: Every day | ORAL | Status: DC
  Administered 2013-11-22 – 2013-11-23 (×2): 1 via ORAL
  Filled 2013-11-21 (×2): qty 1

## 2013-11-21 MED ORDER — OXYTOCIN 40 UNITS IN LACTATED RINGERS INFUSION - SIMPLE MED
62.5000 mL/h | INTRAVENOUS | Status: DC
Start: 2013-11-21 — End: 2013-11-21
  Administered 2013-11-21: 999 mL/h via INTRAVENOUS
  Filled 2013-11-21: qty 1000

## 2013-11-21 MED ORDER — OXYCODONE-ACETAMINOPHEN 5-325 MG PO TABS
1.0000 | ORAL_TABLET | ORAL | Status: DC | PRN
  Administered 2013-11-21 – 2013-11-23 (×6): 1 via ORAL
  Filled 2013-11-21 (×6): qty 1

## 2013-11-21 MED ORDER — SENNOSIDES-DOCUSATE SODIUM 8.6-50 MG PO TABS
2.0000 | ORAL_TABLET | ORAL | Status: DC
  Administered 2013-11-22 (×2): 2 via ORAL
  Filled 2013-11-21: qty 2

## 2013-11-21 MED ORDER — TETANUS-DIPHTH-ACELL PERTUSSIS 5-2.5-18.5 LF-MCG/0.5 IM SUSP
0.5000 mL | Freq: Once | INTRAMUSCULAR | Status: AC
  Administered 2013-11-23: 0.5 mL via INTRAMUSCULAR
  Filled 2013-11-21: qty 0.5

## 2013-11-21 MED ORDER — CITRIC ACID-SODIUM CITRATE 334-500 MG/5ML PO SOLN: 30.0000 mL | ORAL | Status: DC | PRN

## 2013-11-21 MED ORDER — ONDANSETRON HCL 4 MG/2ML IJ SOLN: 4.0000 mg | INTRAMUSCULAR | Status: DC | PRN

## 2013-11-21 MED ORDER — IBUPROFEN 600 MG PO TABS
600.0000 mg | ORAL_TABLET | Freq: Four times a day (QID) | ORAL | Status: DC | PRN
  Administered 2013-11-21: 600 mg via ORAL
  Filled 2013-11-21: qty 1

## 2013-11-21 MED ORDER — ZOLPIDEM TARTRATE 5 MG PO TABS: 5.0000 mg | ORAL_TABLET | Freq: Every evening | ORAL | Status: DC | PRN

## 2013-11-21 MED ORDER — LACTATED RINGERS IV SOLN
INTRAVENOUS | Status: DC
  Administered 2013-11-21: 10:00:00 via INTRAVENOUS

## 2013-11-21 MED ORDER — BENZOCAINE-MENTHOL 20-0.5 % EX AERO
1.0000 | INHALATION_SPRAY | CUTANEOUS | Status: DC | PRN
Start: 2013-11-21 — End: 2013-11-23
  Administered 2013-11-21: 1 via TOPICAL
  Filled 2013-11-21: qty 56

## 2013-11-21 MED ORDER — DIPHENHYDRAMINE HCL 25 MG PO CAPS
25.0000 mg | ORAL_CAPSULE | Freq: Four times a day (QID) | ORAL | Status: DC | PRN
Start: 2013-11-21 — End: 2013-11-23

## 2013-11-21 MED ORDER — PHENYLEPHRINE 40 MCG/ML (10ML) SYRINGE FOR IV PUSH (FOR BLOOD PRESSURE SUPPORT)
80.0000 ug | PREFILLED_SYRINGE | INTRAVENOUS | Status: DC | PRN
  Filled 2013-11-21: qty 2
  Filled 2013-11-21: qty 10

## 2013-11-21 NOTE — H&P (Signed)
NAMCloyd Mercer:  Wessling, Deshia                ACCOUNT NO.:  [id number]  MEDICAL RECORD NO.:  [id number]  LOCATION:  9170                          FACILITY:  [facility]  PHYSICIAN:  Malachi Prohomas F. Ambrose MantleHenley, M.D. DATE OF BIRTH:  February 19, 1991  DATE OF ADMISSION:  11/21/2013 DATE OF DISCHARGE:                             HISTORY & PHYSICAL   PRESENT ILLNESS:  This is a 23 year old white female, para 1-0-0-1, gravida 2 at 40 weeks and 2 days gestation[facility] in early [facility].  Blood group and type B negative, negative antibody, RPR negative.  Urine culture positive for group B strep.  Hepatitis B surface antigen negative, HIV negative, GC and Chlamydia negative. Rubella immune.  Pap test normal.  One hour Glucola 85.  Repeat HIV and RPR negative.  GC and Chlamydia negative.  Group B strep positive.  The patient had an ultrasound on April 05, 2013, seven weeks and 3 days. Although by dates she was 9 weeks and 1 day, [facility]  Ul[facility]ound July 01, 2013, 19 weeks 4 days[facility] They were no abnormalities noted.  She got her RhoGAM at 28.5 weeks. Had a basically uncomplicated prenatal course for the last 24 hours.  It had been contracting frequently.  Her last exam in the office 2 cm, 50%. When I examined her today in the office, she was 3 cm, 80%.  She was having a lot of pain, so she was advised to come to the hospital.  PAST MEDICAL HISTORY:  Reveals a history of migraines.  ALLERGIES:  To pollens, trees, grasses, and flowers no surgical history.  ALLERGIES:  No known drug allergies.  No latex allergy.  SOCIAL HISTORY:  Never smoked.  Does not drink.  FAMILY HISTORY:  Mother with diabetes and high blood pressure.  Brother with high blood pressure.  Father with high blood pressure.  PHYSICAL EXAMINATION:  VITAL SIGNS:  On admission, temperature 98, pulse 112, respirations 18, blood pressure 135/79. HEART:  Normal size and sounds.  No murmurs. LUNGS:   Clear to auscultation.  Fundal height in the office 37 cm.  Fetal heart tones normal.  At this point, cervix by the RNs exam is 9 cm, 100% vertex at 0 station.  ADMITTING IMPRESSION:  Intrauterine pregnancy at 40 weeks and 2 days, positive group B strep and active labor.  The patient is on penicillin and will proceed with progress in labor.     Malachi Prohomas F. Ambrose MantleHenley, M.D.     TFH/MEDQ  D:  11/21/2013  T:  11/21/2013  Job:  [id number]

## 2013-11-21 NOTE — Anesthesia Preprocedure Evaluation (Signed)
Anesthesia Evaluation  Patient identified by MRN, date of birth, ID band Patient awake    Reviewed: Allergy & Precautions, H&P , Patient's Chart, lab work & pertinent test results  Airway Mallampati: III TM Distance: >3 FB Neck ROM: full    Dental no notable dental hx. (+) Teeth Intact   Pulmonary neg pulmonary ROS,  breath sounds clear to auscultation  Pulmonary exam normal       Cardiovascular negative cardio ROS  Rhythm:regular Rate:Normal     Neuro/Psych negative neurological ROS  negative psych ROS   GI/Hepatic negative GI ROS, Neg liver ROS, GERD-  Medicated and Controlled,  Endo/Other  negative endocrine ROSObesity  Renal/GU negative Renal ROS  negative genitourinary   Musculoskeletal   Abdominal Normal abdominal exam  (+)   Peds  Hematology negative hematology ROS (+)   Anesthesia Other Findings   Reproductive/Obstetrics (+) Pregnancy                           Anesthesia Physical Anesthesia Plan  ASA: II  Anesthesia Plan: Epidural   Post-op Pain Management:    Induction:   Airway Management Planned:   Additional Equipment:   Intra-op Plan:   Post-operative Plan:   Informed Consent: I have reviewed the patients History and Physical, chart, labs and discussed the procedure including the risks, benefits and alternatives for the proposed anesthesia with the patient or authorized representative who has indicated his/her understanding and acceptance.     Plan Discussed with: Anesthesiologist  Anesthesia Plan Comments:         Anesthesia Quick Evaluation

## 2013-11-21 NOTE — Progress Notes (Signed)
FHT from 7-5 reviewed.  Reactive NST, no significant decels, irreg ctx. 

## 2013-11-21 NOTE — Anesthesia Procedure Notes (Signed)
Epidural Patient location during procedure: OB Start time: 11/21/2013 11:03 AM  Staffing Anesthesiologist: Keir Foland A. Performed by: anesthesiologist   Preanesthetic Checklist Completed: patient identified, site marked, surgical consent, pre-op evaluation, timeout performed, IV checked, risks and benefits discussed and monitors and equipment checked  Epidural Patient position: sitting Prep: site prepped and draped and DuraPrep Patient monitoring: continuous pulse ox and blood pressure Approach: midline Location: L4-L5 Injection technique: LOR air  Needle:  Needle type: Tuohy  Needle gauge: 17 G Needle length: 9 cm and 9 Needle insertion depth: 8 cm Catheter type: closed end flexible Catheter size: 19 Gauge Catheter at skin depth: 13 cm Test dose: negative and Other  Assessment Events: blood not aspirated, injection not painful, no injection resistance, negative IV test and no paresthesia  Additional Notes Patient identified. Risks and benefits discussed including failed block, incomplete  Pain control, post dural puncture headache, nerve damage, paralysis, blood pressure Changes, nausea, vomiting, reactions to medications-both toxic and allergic and post Partum back pain. All questions were answered. Patient expressed understanding and wished to proceed. Sterile technique was used throughout procedure. Epidural site was Dressed with sterile barrier dressing. No paresthesias, signs of intravascular injection Or signs of intrathecal spread were encountered. Difficult due to poor patient positioning. Attempt x 3 Patient was more comfortable after the epidural was dosed. Please see RN's note for documentation of vital signs and FHR which are stable.

## 2013-11-21 NOTE — Progress Notes (Signed)
Patient ID: Christine Mercer, female   DOB: 05-Nov-1990, 23 y.o.   MRN: [id number] Delivery note:  The pt progressed to full dilatation and pushed well. She delivered a living female infant spontaneously ROA over an intact perineum. Apgars were 9 and 9 at 1 and 5 minutes. The placenta was intact and the uterus was normal. A few membranes were removed with ring forceps. There were bilateral labial lacerations and a small perineal abrasion, none of which were bleeding so were not repaired. EBL 400 cc's.

## 2013-11-22 LAB — CBC
HCT: 32.5 % — ABNORMAL LOW (ref 36.0–46.0)
Hemoglobin: 10.5 g/dL — ABNORMAL LOW (ref 12.0–15.0)
MCH: 29.2 pg (ref 26.0–34.0)
MCHC: 32.3 g/dL (ref 30.0–36.0)
MCV: 90.5 fL (ref 78.0–100.0)
Platelets: 227 10*3/uL (ref 150–400)
RBC: 3.59 MIL/uL — ABNORMAL LOW (ref 3.87–5.11)
RDW: 15.1 % (ref 11.5–15.5)
WBC: 16.1 10*3/uL — ABNORMAL HIGH (ref 4.0–10.5)

## 2013-11-22 NOTE — Progress Notes (Signed)
UR chart review completed.  

## 2013-11-22 NOTE — Progress Notes (Signed)
Patient ID: Susanne GreenhouseKayla Angevine, female   DOB: 1990/09/13, 23 y.o.   MRN: [id number] #1 afebrile BP normal HGB 13.0 to 10.5 No complaints

## 2013-11-22 NOTE — Anesthesia Postprocedure Evaluation (Signed)
Anesthesia Post Note  Patient: Christine GreenhouseKayla Groseclose  Procedure(s) Performed: * No procedures listed *  Anesthesia type: Epidural  Patient location: Mother/Baby  Post pain: Pain level controlled  Post assessment: Post-op Vital signs reviewed  Last Vitals:  Filed Vitals:   11/22/13 0555  BP: 116/62  Pulse: 91  Temp: 36.7 C  Resp: 20    Post vital signs: Reviewed  Level of consciousness:alert  Complications: No apparent anesthesia complications

## 2013-11-23 MED ORDER — OXYCODONE-ACETAMINOPHEN 5-325 MG PO TABS: 1.0000 | ORAL_TABLET | Freq: Four times a day (QID) | ORAL | Status: DC | PRN

## 2013-11-23 MED ORDER — IBUPROFEN 600 MG PO TABS: 600.0000 mg | ORAL_TABLET | Freq: Four times a day (QID) | ORAL | Status: DC | PRN

## 2013-11-23 NOTE — Discharge Instructions (Signed)
booklet °

## 2013-11-23 NOTE — Progress Notes (Signed)
Patient ID: Christine GreenhouseKayla Mercer, female   DOB: 1990-06-27, 23 y.o.   MRN: [id number] #2 afebrile BP normal fior d/c Only complaint is back and lower abdominal pain

## 2013-11-23 NOTE — Discharge Summary (Signed)
NAMCloyd Stagers:  Mercer, Christine                ACCOUNT NO.:  [id number]  MEDICAL RECORD NO.:  [id number]  LOCATION:  9109                          FACILITY:  [facility]  PHYSICIAN:  Malachi Prohomas F. Ambrose MantleHenley, M.D. DATE OF BIRTH:  1991-02-25  DATE OF ADMISSION:  11/21/2013 DATE OF DISCHARGE:  11/23/2013                              DISCHARGE SUMMARY   HISTORY OF PRESENT ILLNESS:  This is a 23 year old white female, para 1- 0-0-1, gravida 2 at 40 weeks and 2 days [facility] in early [facility].  Blood group and type B negative, negative antibody, RPR negative, urine culture positive for group B strep. Hepatitis B surface antigen negative, HIV negative, GC and Chlamydia negative, rubella immune, Pap normal, 1 hour Glucola 85, repeat HIV and RPR negative, GC and Chlamydia negative, group B strep positive.  The patient received RhoGAM at 28-1/2 weeks.  She was admitted to the hospital in labor and rapidly progressed to 9 cm dilatation.  The patient was treated with penicillin for the positive group B strep.  She progressed to full dilatation, pushed well, and delivered a living female infant spontaneously ROA over an intact perineum.  Apgars were 9 and 9 at 1 and 5 minutes.  The placenta was intact.  Uterus was normal.  A few membranes were removed with ring forceps.  There were bilateral labial lacerations and a small perineal abrasion, none of which were bleeding so were not repaired.  Blood loss about 400 mL. Postpartum the patient did quite well and was discharged on the second postpartum day.  Initial hemoglobin 13.0, hematocrit 38.8, white count 16,800, platelet count 230,000, RPR negative.  Followup hemoglobin 10.5, hematocrit 32.5.  FINAL DIAGNOSIS:  Intrauterine pregnancy at 40 weeks and 2 days delivered vertex.  OPERATION:  Spontaneous delivery vertex.  FINAL CONDITION:  Improved.  INSTRUCTIONS:  Our regular discharge instruction booklet as well as the after visit summary.   Prescriptions for Percocet 5/325, 30 tablets and Motrin 600 mg 30 tablets each 1 every 6 hours as needed for pain.  She is to continue her prenatal vitamins.  She wants the baby circumcision done in the office.  I advised her I will not do it after the baby is 512 weeks old.  She is to return in 6 weeks for followup examination.     Malachi Prohomas F. Ambrose MantleHenley, M.D.     TFH/MEDQ  D:  11/23/2013  T:  11/23/2013  Job:  [id number]

## 2013-11-25 LAB — TYPE AND SCREEN
ABO/RH(D): B NEG
ANTIBODY SCREEN: POSITIVE
DAT, IGG: NEGATIVE
UNIT DIVISION: 0
Unit division: 0

## 2013-11-28 ENCOUNTER — Inpatient Hospital Stay (HOSPITAL_COMMUNITY): Admission: RE | Admit: 2013-11-28 | Payer: BC Managed Care – PPO | Source: Ambulatory Visit

## 2013-11-29 ENCOUNTER — Ambulatory Visit (HOSPITAL_COMMUNITY): Payer: Medicaid Other

## 2014-03-20 ENCOUNTER — Encounter (HOSPITAL_COMMUNITY): Payer: Self-pay | Admitting: *Deleted

## 2014-11-15 ENCOUNTER — Ambulatory Visit (INDEPENDENT_AMBULATORY_CARE_PROVIDER_SITE_OTHER): Payer: BLUE CROSS/BLUE SHIELD | Admitting: Family Medicine

## 2014-11-15 VITALS — BP 106/62 | HR 98 | Temp 100.1°F | Resp 18 | Ht 67.5 in | Wt 183.0 lb

## 2014-11-15 DIAGNOSIS — J029 Acute pharyngitis, unspecified: Secondary | ICD-10-CM

## 2014-11-15 DIAGNOSIS — R5081 Fever presenting with conditions classified elsewhere: Secondary | ICD-10-CM

## 2014-11-15 DIAGNOSIS — J039 Acute tonsillitis, unspecified: Secondary | ICD-10-CM

## 2014-11-15 DIAGNOSIS — R1310 Dysphagia, unspecified: Secondary | ICD-10-CM | POA: Diagnosis not present

## 2014-11-15 LAB — POCT CBC
Granulocyte percent: 81.6 %G — AB (ref 37–80)
HCT, POC: 43.5 % (ref 37.7–47.9)
Hemoglobin: 13.6 g/dL (ref 12.2–16.2)
Lymph, poc: 1 (ref 0.6–3.4)
MCH, POC: 27 pg (ref 27–31.2)
MCHC: 31.3 g/dL — AB (ref 31.8–35.4)
MCV: 86.4 fL (ref 80–97)
MID (cbc): 0.3 (ref 0–0.9)
MPV: 8.1 fL (ref 0–99.8)
POC Granulocyte: 6 (ref 2–6.9)
POC LYMPH PERCENT: 13.7 %L (ref 10–50)
POC MID %: 4.7 %M (ref 0–12)
Platelet Count, POC: 222 10*3/uL (ref 142–424)
RBC: 5.03 M/uL (ref 4.04–5.48)
RDW, POC: 15.1 %
WBC: 7.3 10*3/uL (ref 4.6–10.2)

## 2014-11-15 LAB — POCT RAPID STREP A (OFFICE): Rapid Strep A Screen: NEGATIVE

## 2014-11-15 MED ORDER — HYDROCODONE-ACETAMINOPHEN 5-217 MG/10ML PO SOLN: 5.0000 mL | Freq: Three times a day (TID) | ORAL | Status: DC | PRN

## 2014-11-15 MED ORDER — AMOXICILLIN 875 MG PO TABS: 875.0000 mg | ORAL_TABLET | Freq: Two times a day (BID) | ORAL | Status: DC

## 2014-11-15 MED ORDER — MAGIC MOUTHWASH W/LIDOCAINE: 5.0000 mL | Freq: Four times a day (QID) | ORAL | Status: DC | PRN

## 2014-11-15 MED ORDER — IBUPROFEN 200 MG PO TABS
600.0000 mg | ORAL_TABLET | Freq: Once | ORAL | Status: AC
  Administered 2014-11-15: 600 mg via ORAL

## 2014-11-15 MED ORDER — PENICILLIN G BENZATHINE 1200000 UNIT/2ML IM SUSP
1.2000 10*6.[IU] | Freq: Once | INTRAMUSCULAR | Status: AC
  Administered 2014-11-15: 1.2 10*6.[IU] via INTRAMUSCULAR

## 2014-11-15 NOTE — Progress Notes (Signed)
Chief Complaint:  Chief Complaint  Patient presents with  . Sore Throat  . Chills  . Back Pain    HPI: Christine Mercer is a 24 y.o. female who is here for 2 day history of throat pain, fevers, chills, back pain. She has had no urinary symptoms. She has had difficulty with swallowing due to pain in her throat. She also has had sick contacts with her children who have had viral illnesses. She also has a rash on the palms of her hand started prior to yesterday sore throat. It has been a week. Her children were diagnosed with viral illnesses. She has not tried anything for this except for ibuprofen last night.    Past Medical History  Diagnosis Date  . Normal pregnancy, first 07/25/2011  . SVD (spontaneous vaginal delivery) 07/25/2011  . Thyroid disease     hypothyroid   Past Surgical History  Procedure Laterality Date  . No past surgeries     History   Social History  . Marital Status: Single    Spouse Name: N/A  . Number of Children: N/A  . Years of Education: N/A   Social History Main Topics  . Smoking status: Light Tobacco Smoker  . Smokeless tobacco: Never Used  . Alcohol Use: No  . Drug Use: No  . Sexual Activity: Yes    Birth Control/ Protection: None   Other Topics Concern  . None   Social History Narrative   Family History  Problem Relation Age of Onset  . Diabetes Mother   . Hypertension Mother   . Hypertension Father   . Hypertension Brother    No Known Allergies Prior to Admission medications   Medication Sig Start Date End Date Taking? Authorizing Provider  levothyroxine (SYNTHROID, LEVOTHROID) 112 MCG tablet Take 112 mcg by mouth daily before breakfast.   Yes Historical Provider, MD  liothyronine (CYTOMEL) 5 MCG tablet Take 5 mcg by mouth daily.   Yes Historical Provider, MD  oxyCODONE-acetaminophen (PERCOCET/ROXICET) 5-325 MG per tablet Take 1 tablet by mouth every 6 (six) hours as needed for moderate pain. Patient not taking: Reported on  11/15/2014 11/23/13   Tracey Harrieshomas Henley, MD     ROS: The patient denies + fevers, chills,  No night sweats, unintentional weight loss, chest pain, palpitations, wheezing, dyspnea on exertion, nausea, vomiting, abdominal pain, dysuria, hematuria, melena, numbness, weakness, or tingling.   All other systems have been reviewed and were otherwise negative with the exception of those mentioned in the HPI and as above.    PHYSICAL EXAM: Filed Vitals:   11/15/14 1010  BP: 106/62  Pulse: 98  Temp: 100.1 F (37.8 C)  Resp: 18   Filed Vitals:   11/15/14 1010  Height: 5' 7.5" (1.715 m)  Weight: 183 lb (83.008 kg)   Body mass index is 28.22 kg/(m^2).   General: Alert, no acute distress HEENT:  Normocephalic, atraumatic, oropharynx patent. EOMI, PERRLA +e rythematous throat, + tonsils +2, no white  exudates. There is a slight dark lesion that I think might be a broken capillary; TM normal Cardiovascular:  Regular rate and rhythm, no rubs murmurs or gallops.  No Carotid bruits, radial pulse intact. No pedal edema.  Respiratory: Clear to auscultation bilaterally.  No wheezes, rales, or rhonchi.  No cyanosis, no use of accessory musculature GI: No organomegaly, abdomen is soft and non-tender, positive bowel sounds.  No masses. Skin: Positive eczematous rashes on bilateral palms, not ulcerated, she has been taking steroidal  topical cream for this.. Neurologic: Facial musculature symmetric. Psychiatric: Patient is appropriate throughout our interaction. Lymphatic: No cervical lymphadenopathy Musculoskeletal: Gait intact.   LABS: Results for orders placed or performed in visit on 11/15/14  POCT CBC  Result Value Ref Range   WBC 7.3 4.6 - 10.2 K/uL   Lymph, poc 1.0 0.6 - 3.4   POC LYMPH PERCENT 13.7 10 - 50 %L   MID (cbc) 0.3 0 - 0.9   POC MID % 4.7 0 - 12 %M   POC Granulocyte 6.0 2 - 6.9   Granulocyte percent 81.6 (A) 37 - 80 %G   RBC 5.03 4.04 - 5.48 M/uL   Hemoglobin 13.6 12.2 - 16.2 g/dL     HCT, POC 56.2 13.0 - 47.9 %   MCV 86.4 80 - 97 fL   MCH, POC 27.0 27 - 31.2 pg   MCHC 31.3 (A) 31.8 - 35.4 g/dL   RDW, POC 86.5 %   Platelet Count, POC 222 142 - 424 K/uL   MPV 8.1 0 - 99.8 fL  POCT rapid strep A  Result Value Ref Range   Rapid Strep A Screen Negative Negative     EKG/XRAY:   Primary read interpreted by Dr. Conley Rolls at [facility].   ASSESSMENT/PLAN: Encounter Diagnoses  Name Primary?  . Acute pharyngitis, unspecified pharyngitis type Yes  . Acute tonsillitis   . Fever presenting with conditions classified elsewhere   . Odynophagia    Tonsillitis Bicillin L-A injection Prescribed hydrocodone with Tylenol syrup for throat pain, Magic mouthwash with lidocaine when necessary I have also prescribed a prescription for amoxicillin but have told her  to hold onto until evaluation tomorrow. I am worried about potential abscess. She needs to follow-up tomorrow for recheck. Follow-up in one day  Gross sideeffects, risk and benefits, and alternatives of medications d/w patient. Patient is aware that all medications have potential sideeffects and we are unable to predict every sideeffect or drug-drug interaction that may occur.  Burak Zerbe PHUONG, DO 11/15/2014 11:21 AM

## 2014-11-15 NOTE — Patient Instructions (Signed)
Tonsillitis °Tonsillitis is an infection of the throat that causes the tonsils to become red, tender, and swollen. Tonsils are collections of lymphoid tissue at the back of the throat. Each tonsil has crevices (crypts). Tonsils help fight nose and throat infections and keep infection from spreading to other parts of the body for the first 18 months of life.  °CAUSES °Sudden (acute) tonsillitis is usually caused by infection with streptococcal bacteria. Long-lasting (chronic) tonsillitis occurs when the crypts of the tonsils become filled with pieces of food and bacteria, which makes it easy for the tonsils to become repeatedly infected. °SYMPTOMS  °Symptoms of tonsillitis include: °· A sore throat, with possible difficulty swallowing. °· White patches on the tonsils. °· Fever. °· Tiredness. °· New episodes of snoring during sleep, when you did not snore before. °· Small, foul-smelling, yellowish-white pieces of material (tonsilloliths) that you occasionally cough up or spit out. The tonsilloliths can also cause you to have bad breath. °DIAGNOSIS °Tonsillitis can be diagnosed through a physical exam. Diagnosis can be confirmed with the results of lab tests, including a throat culture. °TREATMENT  °The goals of tonsillitis treatment include the reduction of the severity and duration of symptoms and prevention of associated conditions. Symptoms of tonsillitis can be improved with the use of steroids to reduce the swelling. Tonsillitis caused by bacteria can be treated with antibiotic medicines. Usually, treatment with antibiotic medicines is started before the cause of the tonsillitis is known. However, if it is determined that the cause is not bacterial, antibiotic medicines will not treat the tonsillitis. If attacks of tonsillitis are severe and frequent, your health care provider may recommend surgery to remove the tonsils (tonsillectomy). °HOME CARE INSTRUCTIONS  °· Rest as much as possible and get plenty of  sleep. °· Drink plenty of fluids. While the throat is very sore, eat soft foods or liquids, such as sherbet, soups, or instant breakfast drinks. °· Eat frozen ice pops. °· Gargle with a warm or cold liquid to help soothe the throat. Mix 1/4 teaspoon of salt and 1/4 teaspoon of baking soda in 8 oz of water. °SEEK MEDICAL CARE IF:  °· Large, tender lumps develop in your neck. °· A rash develops. °· A green, yellow-brown, or bloody substance is coughed up. °· You are unable to swallow liquids or food for 24 hours. °· You notice that only one of the tonsils is swollen. °SEEK IMMEDIATE MEDICAL CARE IF:  °· You develop any new symptoms such as vomiting, severe headache, stiff neck, chest pain, or trouble breathing or swallowing. °· You have severe throat pain along with drooling or voice changes. °· You have severe pain, unrelieved with recommended medications. °· You are unable to fully open the mouth. °· You develop redness, swelling, or severe pain anywhere in the neck. °· You have a fever. °MAKE SURE YOU:  °· Understand these instructions. °· Will watch your condition. °· Will get help right away if you are not doing well or get worse. °Document Released: 02/12/2005 Document Revised: 09/19/2013 Document Reviewed: 10/22/2012 °Exit[employer]® Patient Information ©2015 [employer]. This information is not intended to replace advice given to you by your health care provider. Make sure you discuss any questions you have with your health care provider. ° °

## 2014-11-16 ENCOUNTER — Ambulatory Visit (INDEPENDENT_AMBULATORY_CARE_PROVIDER_SITE_OTHER): Payer: BLUE CROSS/BLUE SHIELD | Admitting: Family Medicine

## 2014-11-16 VITALS — BP 98/65 | HR 75 | Temp 98.2°F | Resp 18 | Wt 183.0 lb

## 2014-11-16 DIAGNOSIS — J029 Acute pharyngitis, unspecified: Secondary | ICD-10-CM

## 2014-11-16 DIAGNOSIS — R1310 Dysphagia, unspecified: Secondary | ICD-10-CM

## 2014-11-16 DIAGNOSIS — J039 Acute tonsillitis, unspecified: Secondary | ICD-10-CM

## 2014-11-16 LAB — CULTURE, GROUP A STREP: Organism ID, Bacteria: NORMAL

## 2014-11-16 NOTE — Progress Notes (Signed)
 Chief Complaint:  Chief Complaint  Patient presents with  . Follow-up    not any better    HPI: Christine Mercer is a 24 y.o. female who is here for  Recheck of her tonsillitis. She is slightly better. She has had a fever last night at 10:00 and took some Tylenol. She is afebrile today. She has no rashes. She did get a penicillin injection yesterday. She has a prescription for amoxicillin which has not taken yet. She has been able to eat soup last night. She has not had any anti-pyretic since last night. She has no other symptoms.  Past Medical History  Diagnosis Date  . Normal pregnancy, first 07/25/2011  . SVD (spontaneous vaginal delivery) 07/25/2011  . Thyroid disease     hypothyroid   Past Surgical History  Procedure Laterality Date  . No past surgeries     History   Social History  . Marital Status: Single    Spouse Name: N/A  . Number of Children: N/A  . Years of Education: N/A   Social History Main Topics  . Smoking status: Light Tobacco Smoker  . Smokeless tobacco: Never Used  . Alcohol Use: No  . Drug Use: No  . Sexual Activity: Yes    Birth Control/ Protection: None   Other Topics Concern  . None   Social History Narrative   Family History  Problem Relation Age of Onset  . Diabetes Mother   . Hypertension Mother   . Hypertension Father   . Hypertension Brother    No Known Allergies Prior to Admission medications   Medication Sig Start Date End Date Taking? Authorizing Provider  Alum & Mag Hydroxide-Simeth (MAGIC MOUTHWASH W/LIDOCAINE) SOLN Take 5 mLs by mouth 4 (four) times daily as needed for mouth pain. Gargle and spit, may swallow 11/15/14  Yes  P , DO  amoxicillin (AMOXIL) 875 MG tablet Take 1 tablet (875 mg total) by mouth 2 (two) times daily. 11/15/14  Yes  P , DO  Hydrocodone-Acetaminophen 5-217 MG/10ML SOLN Take 5 mLs by mouth every 8 (eight) hours as needed (throat pain not relieved by magic mouthwsh.). 11/15/14  Yes  P , DO    levothyroxine (SYNTHROID, LEVOTHROID) 112 MCG tablet Take 112 mcg by mouth daily before breakfast.   Yes Historical Provider, MD  liothyronine (CYTOMEL) 5 MCG tablet Take 5 mcg by mouth daily.   Yes Historical Provider, MD     ROS: The patient denies night sweats, unintentional weight loss, chest pain, palpitations, wheezing, dyspnea on exertion, nausea, vomiting, abdominal pain, dysuria, hematuria, melena, numbness, weakness, or tingling.   All other systems have been reviewed and were otherwise negative with the exception of those mentioned in the HPI and as above.    PHYSICAL EXAM: Filed Vitals:   11/16/14 0827  BP: 98/65  Pulse: 75  Temp: 98.2 F (36.8 C)  Resp: 18   Filed Vitals:   11/16/14 0827  Weight: 183 lb (83.008 kg)   Body mass index is 28.22 kg/(m^2).   General: Alert, no acute distress HEENT:  Normocephalic, atraumatic, oropharynx patent. EOMI, PERRLA  TM normal, positive tonsillitis, +3 with exudates. Nontender to. She is able to talk without any voice changes, Cardiovascular:  Regular rate and rhythm, no rubs murmurs or gallops.  No Carotid bruits, radial pulse intact. No pedal edema.  Respiratory: Clear to auscultation bilaterally.  No wheezes, rales, or rhonchi.  No cyanosis, no use of accessory musculature GI: No organomegaly, abdomen  is soft and non-tender, positive bowel sounds.  No masses. Skin: No rashes. Neurologic: Facial musculature symmetric. Psychiatric: Patient is appropriate throughout our interaction. Lymphatic: No cervical lymphadenopathy Musculoskeletal: Gait intact.   LABS: Results for orders placed or performed in visit on 11/15/14  POCT CBC  Result Value Ref Range   WBC 7.3 4.6 - 10.2 K/uL   Lymph, poc 1.0 0.6 - 3.4   POC LYMPH PERCENT 13.7 10 - 50 %L   MID (cbc) 0.3 0 - 0.9   POC MID % 4.7 0 - 12 %M   POC Granulocyte 6.0 2 - 6.9   Granulocyte percent 81.6 (A) 37 - 80 %G   RBC 5.03 4.04 - 5.48 M/uL   Hemoglobin 13.6 12.2 - 16.2  g/dL   HCT, POC 69.643.5 29.537.7 - 47.9 %   MCV 86.4 80 - 97 fL   MCH, POC 27.0 27 - 31.2 pg   MCHC 31.3 (A) 31.8 - 35.4 g/dL   RDW, POC 28.415.1 %   Platelet Count, POC 222 142 - 424 K/uL   MPV 8.1 0 - 99.8 fL  POCT rapid strep A  Result Value Ref Range   Rapid Strep A Screen Negative Negative     EKG/XRAY:   Primary read interpreted by Dr. [facility]ley [facility]sLe at [facility].   ASSESSMENT/PLAN: Encounter Diagnoses  Name Primary?  . Acute tonsillitis Yes  . Acute pharyngitis, unspecified pharyngitis type   . Odynophagia     continue with Magic mouth mouthwash with lidocaine,  Hydrocodone syrup when necessary  start amoxicillin today  Call me if you have any increase pain swelling or fever or worsening symptoms. Currently she is slightly better than she was yesterday.  Gross sideeffects, risk and benefits, and alternatives of medications d/w patient. Patient is aware that all medications have potential sideeffects and we are unable to predict every sideeffect or drug-drug interaction that may occur.  ,  PHUONG, DO 11/16/2014 8:44 AM

## 2015-05-20 NOTE — L&D Delivery Note (Signed)
Delivery Note At 2:47 PM a viable and healthy female was delivered via  (Presentation: OA - LOT  ).  APGAR: 8,9 ; weight  P   Placenta status: delivered, intact .  Cord: 3V with the following complications: nuchal x 3 .    Anesthesia:  epidural Episiotomy:  none Lacerations:  none Suture Repair: None Est. Blood Loss (mL):  100cc  Mom to postpartum.  Baby to Couplet care / Skin to Skin.  Christine Mercer, Christine Mercer 02/12/2016, 3:02 PM  Bo/Tdap 11/22/15/RI/B neg/BTL  D/w pt circumcision for female infant, inc r/b/a - wish to proceed

## 2015-05-21 ENCOUNTER — Encounter: Payer: Self-pay | Admitting: Physician Assistant

## 2015-05-21 ENCOUNTER — Ambulatory Visit (INDEPENDENT_AMBULATORY_CARE_PROVIDER_SITE_OTHER): Payer: BLUE CROSS/BLUE SHIELD | Admitting: Physician Assistant

## 2015-05-21 VITALS — BP 124/70 | HR 75 | Temp 98.0°F | Resp 16

## 2015-05-21 DIAGNOSIS — S61205A Unspecified open wound of left ring finger without damage to nail, initial encounter: Secondary | ICD-10-CM

## 2015-05-21 DIAGNOSIS — S61209A Unspecified open wound of unspecified finger without damage to nail, initial encounter: Secondary | ICD-10-CM

## 2015-05-21 NOTE — Progress Notes (Signed)
   Patient ID: Christine Mercer, female    DOB: 1991-04-14, 25 y.o.   MRN: [id number]  PCP: No primary care provider on file.  Subjective:   Chief Complaint  Patient presents with  . Laceration    today, left hand, ring finger    HPI Presents for evaluation of wound to the LEFT Ring finger.  RIGHT hand dominant.  Accidentally cut the LEFT ring finger this morning while cutting celery at home. Unable to remove her wedding band (has been "stuck" for some time, after weight gain associated with pregnancy).  Tdap in 2015.Marland Kitchen.    Review of Systems As above.    There are no active problems to display for this patient.    Prior to Admission medications   Medication Sig Start Date End Date Taking? Authorizing Provider  levothyroxine (SYNTHROID, LEVOTHROID) 112 MCG tablet Take 112 mcg by mouth daily before breakfast.   Yes Historical Provider, MD     No Known Allergies     Objective:  Physical Exam  Constitutional: She is oriented to person, place, and time. She appears well-developed and well-nourished. She is active and cooperative. No distress.  BP 124/70 mmHg  Pulse 75  Temp(Src) 98 F (36.7 C)  Resp 16  SpO2 98%   Eyes: Conjunctivae are normal.  Pulmonary/Chest: Effort normal.  Musculoskeletal:       Left hand: She exhibits tenderness (at wound) and laceration. She exhibits normal range of motion, no bony tenderness, normal capillary refill and no swelling. Normal sensation noted. Normal strength noted.  Neurological: She is alert and oriented to person, place, and time.  Skin: Laceration (1.25 cm curved laceration of the palmar surface of the LEFT ring finger) and lesion (skin is extrememly dry, cracked and peeling in placed, consistent with dishidrotic eczema) noted.  Psychiatric: She has a normal mood and affect. Her speech is normal and behavior is normal.   Unable to remove ring with umbilical tape. Cut band to remove ring.  Verbal consent obtained from patient.   Anesthesia by digital block with 4cc Lidocaine 1% without epinephrine.  Wound scrubbed with soap and water and rinsed.  Wound closed with #4 5-0 Prolene simple interrupted sutures.  Wound cleansed and dressed.         Assessment & Plan:   1. Wound, open, finger, initial encounter Local wound care. Anticipatory guidance. RTC 10 days for suture removal. Sooner PRN.   Fernande Brashelle S. Sanyiah Kanzler, PA-C Physician Assistant-Certified [facility]

## 2015-05-21 NOTE — Patient Instructions (Signed)

## 2015-05-21 NOTE — Progress Notes (Signed)
  Medical screening examination/treatment/procedure(s) were performed by non-physician practitioner and as supervising physician I was immediately available for consultation/collaboration.     

## 2015-07-05 DIAGNOSIS — E039 Hypothyroidism, unspecified: Secondary | ICD-10-CM | POA: Insufficient documentation

## 2015-07-05 LAB — OB RESULTS CONSOLE ABO/RH: RH TYPE: NEGATIVE

## 2015-07-05 LAB — OB RESULTS CONSOLE HEPATITIS B SURFACE ANTIGEN: HEP B S AG: NEGATIVE

## 2015-07-05 LAB — OB RESULTS CONSOLE ANTIBODY SCREEN: Antibody Screen: NEGATIVE

## 2015-07-05 LAB — OB RESULTS CONSOLE GC/CHLAMYDIA
Chlamydia: NEGATIVE
GC PROBE AMP, GENITAL: NEGATIVE

## 2015-07-05 LAB — OB RESULTS CONSOLE HIV ANTIBODY (ROUTINE TESTING): HIV: NONREACTIVE

## 2015-07-05 LAB — OB RESULTS CONSOLE RPR: RPR: NONREACTIVE

## 2015-07-05 LAB — OB RESULTS CONSOLE RUBELLA ANTIBODY, IGM: Rubella: IMMUNE

## 2015-09-04 ENCOUNTER — Ambulatory Visit (INDEPENDENT_AMBULATORY_CARE_PROVIDER_SITE_OTHER): Payer: BLUE CROSS/BLUE SHIELD | Admitting: Family Medicine

## 2015-09-04 VITALS — BP 118/62 | HR 98 | Temp 98.2°F | Resp 18 | Ht 67.0 in | Wt 198.0 lb

## 2015-09-04 DIAGNOSIS — R05 Cough: Secondary | ICD-10-CM | POA: Diagnosis not present

## 2015-09-04 DIAGNOSIS — J988 Other specified respiratory disorders: Secondary | ICD-10-CM

## 2015-09-04 DIAGNOSIS — O0001 Abdominal pregnancy with intrauterine pregnancy: Secondary | ICD-10-CM

## 2015-09-04 DIAGNOSIS — R059 Cough, unspecified: Secondary | ICD-10-CM

## 2015-09-04 DIAGNOSIS — R509 Fever, unspecified: Secondary | ICD-10-CM | POA: Diagnosis not present

## 2015-09-04 DIAGNOSIS — J22 Unspecified acute lower respiratory infection: Secondary | ICD-10-CM

## 2015-09-04 LAB — POCT INFLUENZA A/B
Influenza A, POC: NEGATIVE
Influenza B, POC: NEGATIVE

## 2015-09-04 MED ORDER — AZITHROMYCIN 250 MG PO TABS: ORAL_TABLET | ORAL | Status: DC

## 2015-09-04 NOTE — Progress Notes (Addendum)
By signing my name below I, Shelah Lewandowsky, attest that this documentation has been prepared under the direction and in the presence of Shade Flood, MD. Electonically Signed. Shelah Lewandowsky, Scribe 09/04/2015 at 4:28 PM  Subjective:    Patient ID: Christine Mercer, female    DOB: 1991/01/04, 25 y.o.   MRN: [id number]  Chief Complaint  Patient presents with  . Nasal Congestion    x 1 week  . Cough    HPI Christine Mercer is a 25 y.o. female who presents to the [facility] complaining of cough and nasal congestion that has been constant since initial onset a week ago. Pt also reports having a sore throat. Pt states that she has started to develop a soreness in her chest from coughing 2 days ago. Pt also reports having a measured fever of 101 yesterday. Pt states she took mucinex yesterday. Pt also reports trying Claritin and benadryl with no relief of symptoms. Pt denies getting the flu shot this year.  Pt denies any recent known sick contacts.  Pt states one of her children was flu positive 2 months ago.  Pt denies any leg swelling, calf tenderness, DVTs, history of blood clots. Pt also denies any recent plane rides or long road trips  Pt denies history of smoking.  Pt is 4 months OB and states that the pregnancy is progressing normally with no complications. Pt states she got pregnant even though she had had an IUD in place for 2 years.   Pt has 2 living children.     There are no active problems to display for this patient.  Past Medical History  Diagnosis Date  . Normal pregnancy, first 07/25/2011  . SVD (spontaneous vaginal delivery) 07/25/2011  . Thyroid disease     hypothyroid   Past Surgical History  Procedure Laterality Date  . No past surgeries     No Known Allergies Prior to Admission medications   Medication Sig Start Date End Date Taking? Authorizing Provider  levothyroxine (SYNTHROID, LEVOTHROID) 112 MCG tablet Take 112 mcg by mouth daily before  breakfast.   Yes Historical Provider, MD   Social History   Social History  . Marital Status: Single    Spouse Name: N/A  . Number of Children: N/A  . Years of Education: N/A   Occupational History  . Not on file.   Social History Main Topics  . Smoking status: Former Games developer  . Smokeless tobacco: Never Used  . Alcohol Use: No  . Drug Use: No  . Sexual Activity: Yes    Birth Control/ Protection: None   Other Topics Concern  . Not on file   Social History Narrative      Review of Systems  Constitutional: Positive for fever.  HENT: Positive for congestion.   Respiratory: Positive for cough.   Cardiovascular: Positive for chest pain (with cough). Negative for leg swelling.  Musculoskeletal:       Negative for calf tenderness       Objective:   Physical Exam  Constitutional: She is oriented to person, place, and time. She appears well-developed and well-nourished. No distress.  HENT:  Head: Normocephalic and atraumatic.  Right Ear: Hearing, tympanic membrane, external ear and ear canal normal.  Left Ear: Hearing, tympanic membrane, external ear and ear canal normal.  Nose: Nose normal.  Mouth/Throat: Oropharynx is clear and moist. No oropharyngeal exudate.  No sinus tenderness.  Eyes: Conjunctivae and EOM are normal. Pupils are equal, round, and  reactive to light.  Cardiovascular: Normal rate, regular rhythm, normal heart sounds and intact distal pulses.   No murmur heard. Pulmonary/Chest: Effort normal. No respiratory distress. She has no wheezes. She has no rhonchi.  Pt has faint course breath sounds in her lower lobes bilaterally.    Musculoskeletal:  Calves non tender. No lower extremity edema.  Lymphadenopathy:    She has no cervical adenopathy.  Neurological: She is alert and oriented to person, place, and time.  Skin: Skin is warm and dry. No rash noted.  Psychiatric: She has a normal mood and affect. Her behavior is normal.  Vitals  reviewed.    Filed Vitals:   09/04/15 1548  BP: 118/62  Pulse: 98  Temp: 98.2 F (36.8 C)  Resp: 18  Height:  (1.702 m)  Weight: 198 lb (89.812 kg)  SpO2: 98%   Results for orders placed or performed in visit on 09/04/15  POCT Influenza A/B  Result Value Ref Range   Influenza A, POC Negative Negative   Influenza B, POC Negative Negative        Assessment & Plan:   [name] is a 25 y.o. female Cough - Plan: POCT Influenza A/B, azithromycin (ZITHROMAX) 250 MG tablet  Fever, unspecified - Plan: POCT Influenza A/B  Abdominal pregnancy with intrauterine pregnancy  LRTI (lower respiratory tract infection) - Plan: azithromycin (ZITHROMAX) 250 MG tablet   Currently 4 months pregnant, suspected initial viral upper respiratory infection, but now with increased Chest congestion, cough, and fever. Early bronchitis versus EAP possible. X-ray was deferred with current pregnancy. Flu testing negative.  -Start azithromycin, Z-Pak.  -Mucinex or Mucinex DM if okay with her obstetrical provider, and other symptomatic care per recommendations with obstetrical provider.   -RTC precautions if not improving the next 48-72 hours, sooner if worse. Meds ordered this encounter  Medications  . azithromycin (ZITHROMAX) 250 MG tablet    Sig: Take 2 pills by mouth on day 1, then 1 pill by mouth per day on days 2 through 5.    Dispense:  6 tablet    Refill:  0   Patient Instructions       IF you received an x-ray today, you will receive an invoice from [facility]. Please contact [facility] at [phone] with questions or concerns regarding your invoice.   IF you received labwork today, you will receive an invoice from [employer]. Please contact [facility] at [phone] with questions or concerns regarding your invoice.   Our billing staff will not be able to assist you with questions regarding bills from these companies.  You will  be contacted with the lab results as soon as they are available. The fastest way to get your results is to activate your My Chart account. Instructions are located on the last page of this paperwork. If you have not heard from [place] regarding the results in 2 weeks, please contact this office.    You may have initially had an upper respiratory infection or cold virus, but with recent worsening cough and chest congestion, as well as with your fever, you may have an early bronchitis or pneumonia. Start antibiotic azithromycin 2 pills first day, then 1 pill next 4 days. For cough, check with your OB/GYN, but usually Mucinex or Mucinex DM for 3-5 days is okay, as well as cough drops over-the-counter for short time. Again discuss this with your obstetrical provider.  Make sure you're drinking sufficient fluids, return in the next 2-3 days  if not starting to improve or if fevers persist at that time.   Return to the clinic or go to the nearest emergency room if any of your symptoms worsen or new symptoms occur.  Cough, Adult Coughing is a reflex that clears your throat and your airways. Coughing helps to heal and protect your lungs. It is normal to cough occasionally, but a cough that happens with other symptoms or lasts a long time may be a sign of a condition that needs treatment. A cough may last only 2-3 weeks (acute), or it may last longer than 8 weeks (chronic). CAUSES Coughing is commonly caused by:  Breathing in substances that irritate your lungs.  A viral or bacterial respiratory infection.  Allergies.  Asthma.  Postnasal drip.  Smoking.  Acid backing up from the stomach into the esophagus (gastroesophageal reflux).  Certain medicines.  Chronic lung problems, including COPD (or rarely, lung cancer).  Other medical conditions such as heart failure. HOME CARE INSTRUCTIONS  Pay attention to any changes in your symptoms. Take these actions to help with your discomfort:  Take medicines  only as told by your health care provider.  If you were prescribed an antibiotic medicine, take it as told by your health care provider. Do not stop taking the antibiotic even if you start to feel better.  Talk with your health care provider before you take a cough suppressant medicine.  Drink enough fluid to keep your urine clear or pale yellow.  If the air is dry, use a cold steam vaporizer or humidifier in your bedroom or your home to help loosen secretions.  Avoid anything that causes you to cough at work or at home.  If your cough is worse at night, try sleeping in a semi-upright position.  Avoid cigarette smoke. If you smoke, quit smoking. If you need help quitting, ask your health care provider.  Avoid caffeine.  Avoid alcohol.  Rest as needed. SEEK MEDICAL CARE IF:   You have new symptoms.  You cough up pus.  Your cough does not get better after 2-3 weeks, or your cough gets worse.  You cannot control your cough with suppressant medicines and you are losing sleep.  You develop pain that is getting worse or pain that is not controlled with pain medicines.  You have a fever.  You have unexplained weight loss.  You have night sweats. SEEK IMMEDIATE MEDICAL CARE IF:  You cough up blood.  You have difficulty breathing.  Your heartbeat is very fast.   This information is not intended to replace advice given to you by your health care provider. Make sure you discuss any questions you have with your health care provider.   Document Released: 11/01/2010 Document Revised: 01/24/2015 Document Reviewed: 07/12/2014 Elsevier Interactive Patient Education [employer]2016 Elsevier Inc.         I personally performed the services described in this documentation, which was scribed in my presence. The recorded information has been reviewed and considered, and addended by me as needed.

## 2015-09-04 NOTE — Patient Instructions (Addendum)
IF you received an x-ray today, you will receive an invoice from [facility]. Please contact [facility] at [phone] with questions or concerns regarding your invoice.   IF you received labwork today, you will receive an invoice from [facility]. Please contact [facility] at [phone] with questions or concerns regarding your invoice.   Our billing staff will not be able to assist you with questions regarding bills from these companies.  You will be contacted with the lab results as soon as they are available. The fastest way to get your results is to activate your My Chart account. Instructions are located on the last page of this paperwork. If you have not heard from us regarding the results in 2 weeks, please contact this office.    You may have initially had an upper respiratory infection or cold virus, but with recent worsening cough and chest congestion, as well as with your fever, you may have an early bronchitis or pneumonia. Start antibiotic azithromycin 2 pills first day, then 1 pill next 4 days. For cough, check with your OB/GYN, but usually Mucinex or Mucinex DM for 3-5 days is okay, as well as cough drops over-the-counter for short time. Again discuss this with your obstetrical provider.  Make sure you're drinking sufficient fluids, return in the next 2-3 days if not starting to improve or if fevers persist at that time.   Return to the clinic or go to the nearest emergency room if any of your symptoms worsen or new symptoms occur.  Cough, Adult Coughing is a reflex that clears your throat and your airways. Coughing helps to heal and protect your lungs. It is normal to cough occasionally, but a cough that happens with other symptoms or lasts a long time may be a sign of a condition that needs treatment. A cough may last only 2-3 weeks (acute), or it may last longer than 8 weeks (chronic). CAUSES Coughing is commonly caused  by:  Breathing in substances that irritate your lungs.  A viral or bacterial respiratory infection.  Allergies.  Asthma.  Postnasal drip.  Smoking.  Acid backing up from the stomach into the esophagus (gastroesophageal reflux).  Certain medicines.  Chronic lung problems, including COPD (or rarely, lung cancer).  Other medical conditions such as heart failure. HOME CARE INSTRUCTIONS  Pay attention to any changes in your symptoms. Take these actions to help with your discomfort:  Take medicines only as told by your health care provider.  If you were prescribed an antibiotic medicine, take it as told by your health care provider. Do not stop taking the antibiotic even if you start to feel better.  Talk with your health care provider before you take a cough suppressant medicine.  Drink enough fluid to keep your urine clear or pale yellow.  If the air is dry, use a cold steam vaporizer or humidifier in your bedroom or your home to help loosen secretions.  Avoid anything that causes you to cough at work or at home.  If your cough is worse at night, try sleeping in a semi-upright position.  Avoid cigarette smoke. If you smoke, quit smoking. If you need help quitting, ask your health care provider.  Avoid caffeine.  Avoid alcohol.  Rest as needed. SEEK MEDICAL CARE IF:   You have new symptoms.  You cough up pus.  Your cough does not get better after 2-3 weeks, or your cough gets worse.  You cannot control your cough with  suppressant medicines and you are losing sleep.  You develop pain that is getting worse or pain that is not controlled with pain medicines.  You have a fever.  You have unexplained weight loss.  You have night sweats. SEEK IMMEDIATE MEDICAL CARE IF:  You cough up blood.  You have difficulty breathing.  Your heartbeat is very fast.   This information is not intended to replace advice given to you by your health care provider. Make sure you  discuss any questions you have with your health care provider.   Document Released: 11/01/2010 Document Revised: 01/24/2015 Document Reviewed: 07/12/2014 Elsevier Interactive Patient Education [employer]

## 2016-01-07 DIAGNOSIS — E039 Hypothyroidism, unspecified: Secondary | ICD-10-CM | POA: Insufficient documentation

## 2016-01-07 LAB — OB RESULTS CONSOLE GBS: STREP GROUP B AG: POSITIVE

## 2016-02-06 ENCOUNTER — Encounter (HOSPITAL_COMMUNITY): Payer: Self-pay | Admitting: *Deleted

## 2016-02-06 ENCOUNTER — Telehealth (HOSPITAL_COMMUNITY): Payer: Self-pay | Admitting: *Deleted

## 2016-02-06 NOTE — Telephone Encounter (Signed)
Preadmission screen  

## 2016-02-10 ENCOUNTER — Inpatient Hospital Stay (HOSPITAL_COMMUNITY)
Admission: AD | Admit: 2016-02-10 | Payer: Medicaid Other | Source: Ambulatory Visit | Admitting: Obstetrics and Gynecology

## 2016-02-11 ENCOUNTER — Encounter (HOSPITAL_COMMUNITY): Payer: Self-pay

## 2016-02-11 DIAGNOSIS — Z3483 Encounter for supervision of other normal pregnancy, third trimester: Secondary | ICD-10-CM

## 2016-02-11 HISTORY — DX: Encounter for supervision of other normal pregnancy, third trimester: Z34.83

## 2016-02-11 NOTE — H&P (Signed)
Christine Mercer is a 25 y.o. female G3P2002 at 45+ for IOL given term status and favorable cervix.  Relatively uncomplicated PNC  Pt with hypothyroid - nl TSH in pregnancy.  Pregnancy dated by 8 wk [place].  Decline genetic screening.    OB History    Gravida Para Term Preterm AB Living   3 2 2  0 0 2   SAB TAB Ectopic Multiple Live Births   0 0 0 0 2    G1 3/13 female, 6#13 G2 SVD 7/15, female G3 present  No abn pap (last 12/14) No STDs  Past Medical History:  Diagnosis Date  . Hypothyroidism   . Normal pregnancy in multigravida in third trimester 02/11/2016  . Normal pregnancy, first 07/25/2011  . SVD (spontaneous vaginal delivery) 07/25/2011  . Thyroid disease    hypothyroid   Past Surgical History:  Procedure Laterality Date  . NO PAST SURGERIES     Family History: family history includes Diabetes in her mother; Hypertension in her brother, father, and mother. Social History:  reports that she has quit smoking. She has never used smokeless tobacco. She reports that she does not drink alcohol or use drugs. engaged, works at [employer] PNV, levothyroxine, liothyronine All NKDA     Maternal Diabetes: No Genetic Screening: Declined Maternal Ultrasounds/Referrals: Normal Fetal Ultrasounds or other Referrals:  None Maternal Substance Abuse:  No Significant Maternal Medications:  Meds include: Other: Synthroid, liothyronine Significant Maternal Lab Results:  Lab values include: Group B Strep positive Other Comments:  None  Review of Systems  Constitutional: Negative.   HENT: Negative.   Eyes: Negative.   Respiratory: Negative.   Cardiovascular: Negative.   Gastrointestinal: Negative.   Genitourinary: Negative.   Musculoskeletal: Positive for back pain.  Skin: Negative.   Neurological: Negative.   Psychiatric/Behavioral: Negative.    Maternal Medical History:  Contractions: Onset was more than 2 days ago.   Frequency: irregular.   Perceived severity is moderate.     Fetal activity: Perceived fetal activity is normal.    Prenatal Complications - Diabetes: none.      There were no vitals taken for this visit. Maternal Exam:  Uterine Assessment: Contraction strength is mild.  Contraction frequency is irregular.   Abdomen: Patient reports no abdominal tenderness. Fundal height is appropriate foe gestation.   Estimated fetal weight is 7-8#.   Fetal presentation: vertex  Introitus: Normal vulva. Normal vagina.  Pelvis: adequate for delivery.   Cervix: Cervix evaluated by digital exam.     Physical Exam  Constitutional: She is oriented to person, place, and time. She appears well-developed and well-nourished.  HENT:  Head: Normocephalic and atraumatic.  Cardiovascular: Normal rate and regular rhythm.   Respiratory: Effort normal and breath sounds normal. No respiratory distress. She has no wheezes.  GI: Soft. Bowel sounds are normal. She exhibits no distension. There is no tenderness.  Musculoskeletal: Normal range of motion.  Neurological: She is alert and oriented to person, place, and time.  Skin: Skin is warm and dry.  Psychiatric: She has a normal mood and affect. Her behavior is normal.    Prenatal labs: ABO, Rh: B/Negative/-- (02/16 0000) Antibody: Negative (02/16 0000) Rubella: Immune (02/16 0000) RPR: Nonreactive (02/16 0000)  HBsAg: Negative (02/16 0000)  HIV: Non-reactive (02/16 0000)  GBS: Positive (08/21 0000)   Hgb 13.7/Plt 278/ Ur Cx neg/ GC neg/Chl neg/glucola 101/nl TSH/  Nl anat Rhogam 11/22/15  Assessment/Plan: 25yo G3P2002 at 40+ for IOL AROM after PCN PCN  for GBBS prophylaxis Pitocin for induction Expect SVD    Bovard-Stuckert, Zanden Colver 02/11/2016, 1:10 PM

## 2016-02-12 ENCOUNTER — Encounter (HOSPITAL_COMMUNITY): Payer: Self-pay

## 2016-02-12 ENCOUNTER — Inpatient Hospital Stay (HOSPITAL_COMMUNITY): Payer: Medicaid Other | Admitting: Anesthesiology

## 2016-02-12 ENCOUNTER — Inpatient Hospital Stay (HOSPITAL_COMMUNITY)
Admission: RE | Admit: 2016-02-12 | Discharge: 2016-02-13 | DRG: 775 | Disposition: A | Discharge status: Home / Self Care | Payer: Medicaid Other | Source: Ambulatory Visit | Attending: Obstetrics and Gynecology | Admitting: Obstetrics and Gynecology

## 2016-02-12 DIAGNOSIS — Z833 Family history of diabetes mellitus: Secondary | ICD-10-CM | POA: Diagnosis not present

## 2016-02-12 DIAGNOSIS — Z3A40 40 weeks gestation of pregnancy: Secondary | ICD-10-CM | POA: Diagnosis not present

## 2016-02-12 DIAGNOSIS — E669 Obesity, unspecified: Secondary | ICD-10-CM | POA: Diagnosis present

## 2016-02-12 DIAGNOSIS — Z8249 Family history of ischemic heart disease and other diseases of the circulatory system: Secondary | ICD-10-CM | POA: Diagnosis not present

## 2016-02-12 DIAGNOSIS — Z3483 Encounter for supervision of other normal pregnancy, third trimester: Secondary | ICD-10-CM

## 2016-02-12 DIAGNOSIS — O99214 Obesity complicating childbirth: Secondary | ICD-10-CM | POA: Diagnosis present

## 2016-02-12 DIAGNOSIS — Z6838 Body mass index (BMI) 38.0-38.9, adult: Secondary | ICD-10-CM

## 2016-02-12 DIAGNOSIS — O99284 Endocrine, nutritional and metabolic diseases complicating childbirth: Secondary | ICD-10-CM | POA: Diagnosis present

## 2016-02-12 DIAGNOSIS — E039 Hypothyroidism, unspecified: Secondary | ICD-10-CM | POA: Diagnosis present

## 2016-02-12 DIAGNOSIS — O99824 Streptococcus B carrier state complicating childbirth: Secondary | ICD-10-CM | POA: Diagnosis present

## 2016-02-12 DIAGNOSIS — Z87891 Personal history of nicotine dependence: Secondary | ICD-10-CM

## 2016-02-12 HISTORY — DX: Encounter for supervision of other normal pregnancy, third trimester: Z34.83

## 2016-02-12 LAB — CBC
HEMATOCRIT: 34.2 % — AB (ref 36.0–46.0)
HEMOGLOBIN: 11.5 g/dL — AB (ref 12.0–15.0)
MCH: 28.5 pg (ref 26.0–34.0)
MCHC: 33.6 g/dL (ref 30.0–36.0)
MCV: 84.7 fL (ref 78.0–100.0)
Platelets: 283 10*3/uL (ref 150–400)
RBC: 4.04 MIL/uL (ref 3.87–5.11)
RDW: 15.6 % — AB (ref 11.5–15.5)
WBC: 14.2 10*3/uL — ABNORMAL HIGH (ref 4.0–10.5)

## 2016-02-12 LAB — RPR: RPR: NONREACTIVE

## 2016-02-12 MED ORDER — ACETAMINOPHEN 325 MG PO TABS: 650.0000 mg | ORAL_TABLET | ORAL | Status: DC | PRN

## 2016-02-12 MED ORDER — OXYTOCIN 40 UNITS IN LACTATED RINGERS INFUSION - SIMPLE MED
1.0000 m[IU]/min | INTRAVENOUS | Status: DC
  Administered 2016-02-12: 2 m[IU]/min via INTRAVENOUS

## 2016-02-12 MED ORDER — OXYCODONE HCL 5 MG PO TABS
5.0000 mg | ORAL_TABLET | ORAL | Status: DC | PRN
  Administered 2016-02-13: 5 mg via ORAL
  Filled 2016-02-12: qty 1

## 2016-02-12 MED ORDER — IBUPROFEN 600 MG PO TABS
600.0000 mg | ORAL_TABLET | Freq: Four times a day (QID) | ORAL | Status: DC
  Administered 2016-02-12 – 2016-02-13 (×4): 600 mg via ORAL
  Filled 2016-02-12 (×5): qty 1

## 2016-02-12 MED ORDER — WITCH HAZEL-GLYCERIN EX PADS: 1.0000 "application " | MEDICATED_PAD | CUTANEOUS | Status: DC | PRN

## 2016-02-12 MED ORDER — PRENATAL MULTIVITAMIN CH
1.0000 | ORAL_TABLET | Freq: Every day | ORAL | Status: DC
  Administered 2016-02-13: 1 via ORAL
  Filled 2016-02-12: qty 1

## 2016-02-12 MED ORDER — ZOLPIDEM TARTRATE 5 MG PO TABS: 5.0000 mg | ORAL_TABLET | Freq: Every evening | ORAL | Status: DC | PRN

## 2016-02-12 MED ORDER — LACTATED RINGERS IV SOLN: 500.0000 mL | Freq: Once | INTRAVENOUS | Status: DC

## 2016-02-12 MED ORDER — OXYCODONE-ACETAMINOPHEN 5-325 MG PO TABS: 1.0000 | ORAL_TABLET | ORAL | Status: DC | PRN

## 2016-02-12 MED ORDER — TERBUTALINE SULFATE 1 MG/ML IJ SOLN
0.2500 mg | Freq: Once | INTRAMUSCULAR | Status: DC | PRN
Start: 2016-02-12 — End: 2016-02-12
  Filled 2016-02-12: qty 1

## 2016-02-12 MED ORDER — LIDOCAINE HCL (PF) 1 % IJ SOLN
30.0000 mL | INTRAMUSCULAR | Status: DC | PRN
  Filled 2016-02-12: qty 30

## 2016-02-12 MED ORDER — OXYCODONE-ACETAMINOPHEN 5-325 MG PO TABS
2.0000 | ORAL_TABLET | ORAL | Status: DC | PRN
Start: 2016-02-12 — End: 2016-02-12

## 2016-02-12 MED ORDER — DIBUCAINE 1 % RE OINT: 1.0000 "application " | TOPICAL_OINTMENT | RECTAL | Status: DC | PRN

## 2016-02-12 MED ORDER — SOD CITRATE-CITRIC ACID 500-334 MG/5ML PO SOLN: 30.0000 mL | ORAL | Status: DC | PRN

## 2016-02-12 MED ORDER — DIPHENHYDRAMINE HCL 25 MG PO CAPS: 25.0000 mg | ORAL_CAPSULE | Freq: Four times a day (QID) | ORAL | Status: DC | PRN

## 2016-02-12 MED ORDER — LACTATED RINGERS IV SOLN: 500.0000 mL | INTRAVENOUS | Status: DC | PRN

## 2016-02-12 MED ORDER — DIPHENHYDRAMINE HCL 50 MG/ML IJ SOLN
12.5000 mg | INTRAMUSCULAR | Status: DC | PRN
Start: 2016-02-12 — End: 2016-02-12

## 2016-02-12 MED ORDER — BUTORPHANOL TARTRATE 1 MG/ML IJ SOLN
1.0000 mg | INTRAMUSCULAR | Status: DC | PRN
Start: 2016-02-12 — End: 2016-02-12

## 2016-02-12 MED ORDER — BUPIVACAINE HCL (PF) 0.25 % IJ SOLN
INTRAMUSCULAR | Status: DC | PRN
  Administered 2016-02-12: 5 mL via EPIDURAL

## 2016-02-12 MED ORDER — ONDANSETRON HCL 4 MG/2ML IJ SOLN: 4.0000 mg | Freq: Four times a day (QID) | INTRAMUSCULAR | Status: DC | PRN

## 2016-02-12 MED ORDER — ONDANSETRON HCL 4 MG PO TABS: 4.0000 mg | ORAL_TABLET | ORAL | Status: DC | PRN

## 2016-02-12 MED ORDER — OXYCODONE HCL 5 MG PO TABS
10.0000 mg | ORAL_TABLET | ORAL | Status: DC | PRN
  Administered 2016-02-12 – 2016-02-13 (×3): 10 mg via ORAL
  Filled 2016-02-12 (×3): qty 2

## 2016-02-12 MED ORDER — EPHEDRINE 5 MG/ML INJ
10.0000 mg | INTRAVENOUS | Status: DC | PRN
  Filled 2016-02-12: qty 4

## 2016-02-12 MED ORDER — PHENYLEPHRINE 40 MCG/ML (10ML) SYRINGE FOR IV PUSH (FOR BLOOD PRESSURE SUPPORT)
80.0000 ug | PREFILLED_SYRINGE | INTRAVENOUS | Status: DC | PRN
  Administered 2016-02-12 (×2): 80 ug via INTRAVENOUS
  Filled 2016-02-12: qty 5
  Filled 2016-02-12 (×2): qty 10

## 2016-02-12 MED ORDER — TETANUS-DIPHTH-ACELL PERTUSSIS 5-2.5-18.5 LF-MCG/0.5 IM SUSP: 0.5000 mL | Freq: Once | INTRAMUSCULAR | Status: DC

## 2016-02-12 MED ORDER — LEVOTHYROXINE SODIUM 112 MCG PO TABS
112.0000 ug | ORAL_TABLET | Freq: Every day | ORAL | Status: DC
  Filled 2016-02-12 (×3): qty 1

## 2016-02-12 MED ORDER — LACTATED RINGERS IV SOLN: INTRAVENOUS | Status: DC

## 2016-02-12 MED ORDER — COCONUT OIL OIL: 1.0000 "application " | TOPICAL_OIL | Status: DC | PRN

## 2016-02-12 MED ORDER — SIMETHICONE 80 MG PO CHEW: 80.0000 mg | CHEWABLE_TABLET | ORAL | Status: DC | PRN

## 2016-02-12 MED ORDER — BENZOCAINE-MENTHOL 20-0.5 % EX AERO: 1.0000 "application " | INHALATION_SPRAY | CUTANEOUS | Status: DC | PRN

## 2016-02-12 MED ORDER — LIOTHYRONINE SODIUM 5 MCG PO TABS
5.0000 ug | ORAL_TABLET | Freq: Every day | ORAL | Status: DC
  Administered 2016-02-13: 5 ug via ORAL
  Filled 2016-02-12 (×2): qty 1

## 2016-02-12 MED ORDER — FENTANYL 2.5 MCG/ML BUPIVACAINE 1/10 % EPIDURAL INFUSION (WH - ANES)
14.0000 mL/h | INTRAMUSCULAR | Status: DC | PRN
  Administered 2016-02-12: 10 mL/h via EPIDURAL
  Filled 2016-02-12: qty 125

## 2016-02-12 MED ORDER — ONDANSETRON HCL 4 MG/2ML IJ SOLN: 4.0000 mg | INTRAMUSCULAR | Status: DC | PRN

## 2016-02-12 MED ORDER — LACTATED RINGERS IV SOLN
INTRAVENOUS | Status: DC
  Administered 2016-02-12 (×2): via INTRAVENOUS

## 2016-02-12 MED ORDER — LACTATED RINGERS IV SOLN
500.0000 mL | Freq: Once | INTRAVENOUS | Status: AC
  Administered 2016-02-12: 500 mL via INTRAVENOUS

## 2016-02-12 MED ORDER — PENICILLIN G POTASSIUM 5000000 UNITS IJ SOLR
5.0000 10*6.[IU] | Freq: Once | INTRAMUSCULAR | Status: AC
  Administered 2016-02-12: 5 10*6.[IU] via INTRAVENOUS
  Filled 2016-02-12: qty 5

## 2016-02-12 MED ORDER — SENNOSIDES-DOCUSATE SODIUM 8.6-50 MG PO TABS
2.0000 | ORAL_TABLET | ORAL | Status: DC
  Administered 2016-02-12: 2 via ORAL
  Filled 2016-02-12: qty 2

## 2016-02-12 MED ORDER — LIDOCAINE HCL (PF) 1 % IJ SOLN
INTRAMUSCULAR | Status: DC | PRN
  Administered 2016-02-12: 3 mL via EPIDURAL
  Administered 2016-02-12: 5 mL via EPIDURAL
  Administered 2016-02-12: 2 mL via EPIDURAL

## 2016-02-12 MED ORDER — ACETAMINOPHEN 325 MG PO TABS
650.0000 mg | ORAL_TABLET | ORAL | Status: DC | PRN
  Administered 2016-02-12 – 2016-02-13 (×3): 650 mg via ORAL
  Filled 2016-02-12 (×3): qty 2

## 2016-02-12 MED ORDER — PENICILLIN G POTASSIUM 5000000 UNITS IJ SOLR
2.5000 10*6.[IU] | INTRAVENOUS | Status: DC
  Administered 2016-02-12: 2.5 10*6.[IU] via INTRAVENOUS
  Filled 2016-02-12 (×4): qty 2.5

## 2016-02-12 MED ORDER — OXYTOCIN BOLUS FROM INFUSION
500.0000 mL | Freq: Once | INTRAVENOUS | Status: AC
  Administered 2016-02-12: 500 mL via INTRAVENOUS

## 2016-02-12 MED ORDER — OXYTOCIN 40 UNITS IN LACTATED RINGERS INFUSION - SIMPLE MED
2.5000 [IU]/h | INTRAVENOUS | Status: DC
  Filled 2016-02-12: qty 1000

## 2016-02-12 MED ORDER — PHENYLEPHRINE 40 MCG/ML (10ML) SYRINGE FOR IV PUSH (FOR BLOOD PRESSURE SUPPORT)
80.0000 ug | PREFILLED_SYRINGE | INTRAVENOUS | Status: AC | PRN
  Administered 2016-02-12 (×3): 80 ug via INTRAVENOUS

## 2016-02-12 NOTE — Progress Notes (Signed)
Patient ID: Christine GreenhouseKayla Mercer, female   DOB: [birth year], 24 y.o.   MRN: [id number]   Comfortable with epidural, BP has dropped some - Pt doesn't feel great.  AFVSS gen NAD FHTs 120's, good var, category 1 toco Q 2-804min  AROM for clear fluid SVE 5/50/-2  Expect SVD, closely monitor BP

## 2016-02-12 NOTE — Anesthesia Pain Management Evaluation Note (Signed)
  CRNA Pain Management Visit Note  Patient: Christine Mercer, 25 y.o., female  "Hello I am a member of the anesthesia team at [facility]. We have an anesthesia team available at all times to provide care throughout the hospital, including epidural management and anesthesia for C-section. I don't know your plan for the delivery whether it a natural birth, water birth, IV sedation, nitrous supplementation, doula or epidural, but we want to meet your pain goals."   1.Was your pain managed to your expectations on prior hospitalizations?   Yes   2.What is your expectation for pain management during this hospitalization?     Epidural  3.How can we help you reach that goal? Epidural, when ready.  Record the patient's initial score and the patient's pain goal.   Pain: 0  Pain Goal: 4 [facility] wants you to be able to say your pain was always managed very well.  Okechukwu Regnier L 02/12/2016

## 2016-02-12 NOTE — Anesthesia Preprocedure Evaluation (Signed)
Anesthesia Evaluation  Patient identified by MRN, date of birth, ID band Patient awake    Reviewed: Allergy & Precautions, NPO status , Patient's Chart, lab work & pertinent test results  History of Anesthesia Complications (+) history of anesthetic complications (hypotensive with previous epidurals)  Airway Mallampati: II  TM Distance: >3 FB Neck ROM: Full    Dental  (+) Teeth Intact, Dental Advisory Given   Pulmonary former smoker,    Pulmonary exam normal breath sounds clear to auscultation       Cardiovascular Exercise Tolerance: Good negative cardio ROS Normal cardiovascular exam Rhythm:Regular Rate:Normal     Neuro/Psych negative neurological ROS  negative psych ROS   GI/Hepatic negative GI ROS, Neg liver ROS,   Endo/Other  Hypothyroidism Obesity  Renal/GU negative Renal ROS     Musculoskeletal negative musculoskeletal ROS (+)   Abdominal   Peds  Hematology  (+) Blood dyscrasia, anemia , Plt 283k   Anesthesia Other Findings Day of surgery medications reviewed with the patient.  Reproductive/Obstetrics (+) Pregnancy                             Anesthesia Physical Anesthesia Plan  ASA: II  Anesthesia Plan: Epidural   Post-op Pain Management:    Induction:   Airway Management Planned:   Additional Equipment:   Intra-op Plan:   Post-operative Plan:   Informed Consent: I have reviewed the patients History and Physical, chart, labs and discussed the procedure including the risks, benefits and alternatives for the proposed anesthesia with the patient or authorized representative who has indicated his/her understanding and acceptance.   Dental advisory given  Plan Discussed with:   Anesthesia Plan Comments: (Patient identified. Risks/Benefits/Options discussed with patient including but not limited to bleeding, infection, nerve damage, paralysis, failed block, incomplete  pain control, headache, blood pressure changes, nausea, vomiting, reactions to medication both or allergic, itching and postpartum back pain. Confirmed with bedside nurse the patient's most recent platelet count. Confirmed with patient that they are not currently taking any anticoagulation, have any bleeding history or any family history of bleeding disorders. Patient expressed understanding and wished to proceed. All questions were answered. )        Anesthesia Quick Evaluation

## 2016-02-12 NOTE — Anesthesia Procedure Notes (Signed)
Epidural Patient location during procedure: OB  Staffing Anesthesiologist: Ellin Fitzgibbons EDWARD Performed: anesthesiologist   Preanesthetic Checklist Completed: patient identified, pre-op evaluation, timeout performed, IV checked, risks and benefits discussed and monitors and equipment checked  Epidural Patient position: sitting Prep: DuraPrep Patient monitoring: blood pressure and continuous pulse ox Approach: midline Location: L3-L4 Injection technique: LOR air  Needle:  Needle type: Tuohy  Needle gauge: 17 G Needle length: 9 cm Needle insertion depth: 7 cm Catheter size: 19 Gauge Catheter at skin depth: 12 cm Test dose: negative and Other (1% Lidocaine)  Additional Notes Patient identified.  Risk benefits discussed including failed block, incomplete pain control, headache, nerve damage, paralysis, blood pressure changes, nausea, vomiting, reactions to medication both toxic or allergic, and postpartum back pain.  Patient expressed understanding and wished to proceed.  All questions were answered.  Sterile technique used throughout procedure and epidural site dressed with sterile barrier dressing. No paresthesia or other complications noted. The patient did not experience any signs of intravascular injection such as tinnitus or metallic taste in mouth nor signs of intrathecal spread such as rapid motor block. Please see nursing notes for vital signs. Reason for block:procedure for pain     

## 2016-02-12 NOTE — Anesthesia Postprocedure Evaluation (Signed)
Anesthesia Post Note  Patient: Susanne GreenhouseKayla Wunschel  Procedure(s) Performed: * No procedures listed *  Patient location during evaluation: Mother Baby Anesthesia Type: Epidural Level of consciousness: awake and alert and oriented Pain management: satisfactory to patient Vital Signs Assessment: post-procedure vital signs reviewed and stable Respiratory status: spontaneous breathing and nonlabored ventilation Cardiovascular status: stable Postop Assessment: no headache, no backache, no signs of nausea or vomiting, adequate PO intake and patient able to bend at knees (patient up walking) Anesthetic complications: no     Last Vitals:  Vitals:   02/12/16 1726 02/12/16 1943  BP: 123/65 (!) 131/59  Pulse: 85 94  Resp: 18 20  Temp: 36.5 C 36.4 C    Last Pain:  Vitals:   02/12/16 1946  TempSrc:   PainSc: 8    Pain Goal: Patients Stated Pain Goal: 2 (02/12/16 1946)               Madison HickmanGREGORY,Keili Hasten

## 2016-02-12 NOTE — Progress Notes (Signed)
Patient ID: Susanne GreenhouseKayla Stratmann, female   DOB: May 16, 1991, 25 y.o.   MRN: [id number]   No c/o's.  +FM, no LOF, no VB, occ ctx  AFVSS gen NAD FHTs 140's, good var, category 1 toco occ  Will start IOL with pitocin, AROM after PCN

## 2016-02-13 LAB — CBC
HCT: 31.2 % — ABNORMAL LOW (ref 36.0–46.0)
Hemoglobin: 10.6 g/dL — ABNORMAL LOW (ref 12.0–15.0)
MCH: 29.2 pg (ref 26.0–34.0)
MCHC: 34 g/dL (ref 30.0–36.0)
MCV: 86 fL (ref 78.0–100.0)
PLATELETS: 233 10*3/uL (ref 150–400)
RBC: 3.63 MIL/uL — AB (ref 3.87–5.11)
RDW: 15.7 % — ABNORMAL HIGH (ref 11.5–15.5)
WBC: 15.5 10*3/uL — ABNORMAL HIGH (ref 4.0–10.5)

## 2016-02-13 MED ORDER — IBUPROFEN 600 MG PO TABS: 600.0000 mg | ORAL_TABLET | Freq: Four times a day (QID) | ORAL | 0 refills | Status: DC

## 2016-02-13 MED ORDER — OXYCODONE HCL 5 MG PO TABS: 5.0000 mg | ORAL_TABLET | ORAL | 0 refills | Status: DC | PRN

## 2016-02-13 MED ORDER — RHO D IMMUNE GLOBULIN 1500 UNIT/2ML IJ SOSY
300.0000 ug | PREFILLED_SYRINGE | Freq: Once | INTRAMUSCULAR | Status: AC
  Administered 2016-02-13: 300 ug via INTRAVENOUS
  Filled 2016-02-13: qty 2

## 2016-02-13 NOTE — Progress Notes (Signed)
PPD #1 No problems Afeb, VSS Fundus firm, NT at U-1 Continue routine postpartum care, will do circ in the office 

## 2016-02-13 NOTE — Progress Notes (Signed)
UR chart review completed.  

## 2016-02-13 NOTE — Discharge Instructions (Signed)
As per discharge pamphlet °

## 2016-02-13 NOTE — Discharge Summary (Signed)
    OB Discharge Summary     Patient Name: Christine GreenhouseKayla Mercer DOB: 1990-11-13 MRN: [id number]  Date of admission: 02/12/2016 Delivering MD: Sherian ReinBOVARD-STUCKERT, JODY   Date of discharge: 02/13/2016  Admitting diagnosis: INDUCTION Intrauterine pregnancy: 3465w2d     Secondary diagnosis:  Active Problems:   Normal pregnancy in multigravida in third trimester   SVD (spontaneous vaginal delivery)      Discharge diagnosis: Term Pregnancy Delivered                                    Hospital course:  Induction of Labor With Vaginal Delivery   25 y.o. yo G3P3003 at 5765w2d was admitted to the hospital 02/12/2016 for induction of labor.  Indication for induction: Favorable cervix at term.  Patient had an uncomplicated labor course as follows: Membrane Rupture Time/Date: 12:01 PM ,02/12/2016   Intrapartum Procedures: Episiotomy: None [1]                                         Lacerations:  None [1]  Patient had delivery of a Viable infant.  Information for the patient's newborn:  Christine Mercer, Christine Mercer [[id number]][[id number]]  Delivery Method: Vaginal, Spontaneous Delivery (Filed from Delivery Summary)   02/12/2016  Details of delivery can be found in separate delivery note.  Patient had a routine postpartum course. Patient is discharged home 02/13/16.   Physical exam Vitals:   02/12/16 1630 02/12/16 1726 02/12/16 1943 02/13/16 0500  BP: (!) 123/51 123/65 (!) 131/59 (!) 116/52  Pulse: 77 85 94 93  Resp: 18 18 20 18   Temp: 97.6 F (36.4 C) 97.7 F (36.5 C) 97.5 F (36.4 C) 97.9 F (36.6 C)  TempSrc: Oral Axillary  Oral  SpO2:      Weight:      Height:       General: alert Lochia: appropriate Uterine Fundus: firm  Labs: Lab Results  Component Value Date   WBC 15.5 (H) 02/13/2016   HGB 10.6 (L) 02/13/2016   HCT 31.2 (L) 02/13/2016   MCV 86.0 02/13/2016   PLT 233 02/13/2016   No flowsheet data found.  Discharge instruction: per After Visit Summary and "Baby and Me Booklet".  After visit meds:     Medication List    TAKE these medications   ibuprofen 600 MG tablet Commonly known as:  ADVIL,MOTRIN Take 1 tablet (600 mg total) by mouth every 6 (six) hours.   levothyroxine 112 MCG tablet Commonly known as:  SYNTHROID, LEVOTHROID Take 112 mcg by mouth daily before breakfast.   liothyronine 5 MCG tablet Commonly known as:  CYTOMEL take 1 tabletr by mouth daily   oxyCODONE 5 MG immediate release tablet Commonly known as:  Oxy IR/ROXICODONE Take 1 tablet (5 mg total) by mouth every 4 (four) hours as needed (pain scale 4-7).       Diet: routine diet  Activity: Advance as tolerated. Pelvic rest for 6 weeks.   Outpatient follow up:4 weeks   Newborn Data: Live born female  Birth Weight: 8 lb 5.5 oz (3785 g) APGAR: 8, 9  Baby Feeding: Bottle Disposition:home with mother   02/13/2016 Christine Mercer,Christine Deschene D, MD

## 2016-02-14 LAB — RH IG WORKUP (INCLUDES ABO/RH)
ABO/RH(D): B NEG
Fetal Screen: NEGATIVE
Gestational Age(Wks): 40.2
Unit division: 0

## 2016-02-16 LAB — TYPE AND SCREEN
ABO/RH(D): B NEG
ANTIBODY SCREEN: NEGATIVE
Unit division: 0
Unit division: 0

## 2016-03-06 ENCOUNTER — Encounter (HOSPITAL_COMMUNITY): Payer: Self-pay

## 2016-03-06 ENCOUNTER — Encounter (HOSPITAL_COMMUNITY)
Admission: RE | Admit: 2016-03-06 | Discharge: 2016-03-06 | Disposition: A | Discharge status: Home / Self Care | Payer: Medicaid Other | Source: Ambulatory Visit | Attending: Obstetrics and Gynecology | Admitting: Obstetrics and Gynecology

## 2016-03-06 DIAGNOSIS — Z01812 Encounter for preprocedural laboratory examination: Secondary | ICD-10-CM | POA: Diagnosis not present

## 2016-03-06 LAB — CBC
HCT: 39.6 % (ref 36.0–46.0)
HEMOGLOBIN: 12.9 g/dL (ref 12.0–15.0)
MCH: 27.5 pg (ref 26.0–34.0)
MCHC: 32.6 g/dL (ref 30.0–36.0)
MCV: 84.4 fL (ref 78.0–100.0)
PLATELETS: 242 10*3/uL (ref 150–400)
RBC: 4.69 MIL/uL (ref 3.87–5.11)
RDW: 14.6 % (ref 11.5–15.5)
WBC: 7.8 10*3/uL (ref 4.0–10.5)

## 2016-03-06 LAB — BASIC METABOLIC PANEL
ANION GAP: 6 (ref 5–15)
BUN: 12 mg/dL (ref 6–20)
CHLORIDE: 106 mmol/L (ref 101–111)
CO2: 27 mmol/L (ref 22–32)
Calcium: 9.3 mg/dL (ref 8.9–10.3)
Creatinine, Ser: 0.7 mg/dL (ref 0.44–1.00)
GFR calc Af Amer: >60 mL/min (ref >60)
Glucose, Bld: 83 mg/dL (ref 65–99)
POTASSIUM: 4.4 mmol/L (ref 3.5–5.1)
SODIUM: 139 mmol/L (ref 135–145)

## 2016-03-06 NOTE — Patient Instructions (Signed)
Your procedure is scheduled on:  Monday, Oct. 23, 2017  [facility] at:  10:15 AM  Pick up the phone at the desk and dial [phone].  Call this number if you have problems the morning of surgery: [phone].  Remember: Do NOT eat food or drink after:  Midnight Sunday  Take these medicines the morning of surgery with a SIP OF WATER:  Levothyroxine, Liothyronine  Stop ALL herbal medications at this time   Do NOT wear jewelry (body piercing), metal hair clips/bobby pins, make-up, or nail polish. Do NOT wear lotions, powders, or perfumes.  You may wear deodorant. Do NOT shave for 48 hours prior to surgery. Do NOT bring valuables to the hospital. Contacts, dentures, or bridgework may not be worn into surgery.  Have a responsible adult drive you home and stay with you for 24 hours after your procedure

## 2016-03-09 NOTE — H&P (Signed)
Christine Mercer is an 25 y.o. female [phone] s/p SVD, for BTL by fulguration.  Recent delivery, Tubal papers were not mature until 10/20.  Doing well, spoke to pat at length r/b/a of BTL - pt aware of failure and other reversible long term forms of contraception.    Pertinent Gynecological History: G3 P3003 - SVD x 3 No abn pap - last 12/14 No STDS  Menstrual History: No LMP recorded. recent delivery    Past Medical History:  Diagnosis Date  . Hypothyroidism   . Normal pregnancy in multigravida in third trimester 02/11/2016  . Normal pregnancy, first 07/25/2011  . SVD (spontaneous vaginal delivery) 07/25/2011  . Thyroid disease    hypothyroid    Past Surgical History:  Procedure Laterality Date  . NO PAST SURGERIES      Family History  Problem Relation Age of Onset  . Diabetes Mother   . Hypertension Mother   . Hypertension Father   . Hypertension Brother     Social History:  reports that she has never smoked. She has never used smokeless tobacco. She reports that she does not drink alcohol or use drugs.  Allergies: No Known Allergies  No prescriptions prior to admission.    Review of Systems  Constitutional: Negative.   HENT: Negative.   Eyes: Negative.   Respiratory: Negative.   Cardiovascular: Negative.   Gastrointestinal: Negative.   Genitourinary: Negative.   Musculoskeletal: Negative.   Skin: Negative.   Neurological: Negative.   Psychiatric/Behavioral: Negative.     not currently breastfeeding. Physical Exam  Constitutional: She is oriented to person, place, and time. She appears well-developed and well-nourished.  HENT:  Head: Normocephalic and atraumatic.  Cardiovascular: Normal rate and regular rhythm.   Respiratory: Effort normal and breath sounds normal. No respiratory distress. She has no wheezes.  GI: Soft. Bowel sounds are normal. She exhibits no distension. There is no tenderness.  Musculoskeletal: Normal range of motion.  Neurological: She is  alert and oriented to person, place, and time.  Skin: Skin is warm and dry.  Psychiatric: She has a normal mood and affect. Her behavior is normal.    No results found for this or any previous visit (from the past 24 hour(s)).  No results found.  Assessment/Plan: 46WU 25 G3P3003 with undesired fertility desires BTL by fulguration.  D/w pt r/b/a.  Will proceed.   Bovard-Stuckert, Kippy Melena 03/09/2016, 9:25 PM

## 2016-03-10 ENCOUNTER — Ambulatory Visit (HOSPITAL_COMMUNITY): Payer: BLUE CROSS/BLUE SHIELD | Admitting: Anesthesiology

## 2016-03-10 ENCOUNTER — Encounter (HOSPITAL_COMMUNITY)
Admission: RE | Disposition: A | Discharge status: Home / Self Care | Payer: Self-pay | Source: Ambulatory Visit | Attending: Obstetrics and Gynecology

## 2016-03-10 ENCOUNTER — Encounter (HOSPITAL_COMMUNITY): Payer: Self-pay | Admitting: Anesthesiology

## 2016-03-10 ENCOUNTER — Ambulatory Visit (HOSPITAL_COMMUNITY)
Admission: RE | Admit: 2016-03-10 | Discharge: 2016-03-10 | Disposition: A | Discharge status: Home / Self Care | Payer: BLUE CROSS/BLUE SHIELD | Source: Ambulatory Visit | Attending: Obstetrics and Gynecology | Admitting: Obstetrics and Gynecology

## 2016-03-10 DIAGNOSIS — Z9851 Tubal ligation status: Secondary | ICD-10-CM

## 2016-03-10 DIAGNOSIS — E039 Hypothyroidism, unspecified: Secondary | ICD-10-CM | POA: Diagnosis not present

## 2016-03-10 DIAGNOSIS — E669 Obesity, unspecified: Secondary | ICD-10-CM | POA: Diagnosis not present

## 2016-03-10 DIAGNOSIS — Z6833 Body mass index (BMI) 33.0-33.9, adult: Secondary | ICD-10-CM | POA: Insufficient documentation

## 2016-03-10 DIAGNOSIS — Z302 Encounter for sterilization: Secondary | ICD-10-CM | POA: Insufficient documentation

## 2016-03-10 HISTORY — PX: LAPAROSCOPIC TUBAL LIGATION: SHX1937

## 2016-03-10 HISTORY — DX: Tubal ligation status: Z98.51

## 2016-03-10 LAB — PREGNANCY, URINE: PREG TEST UR: NEGATIVE

## 2016-03-10 SURGERY — LIGATION, FALLOPIAN TUBE, LAPAROSCOPIC
Anesthesia: Epidural | Site: Abdomen | Laterality: Bilateral

## 2016-03-10 MED ORDER — LACTATED RINGERS IV SOLN: INTRAVENOUS | Status: DC

## 2016-03-10 MED ORDER — ROCURONIUM BROMIDE 100 MG/10ML IV SOLN
INTRAVENOUS | Status: DC | PRN
  Administered 2016-03-10: 50 mg via INTRAVENOUS

## 2016-03-10 MED ORDER — OXYCODONE HCL 5 MG PO TABS: 5.0000 mg | ORAL_TABLET | Freq: Once | ORAL | Status: DC | PRN

## 2016-03-10 MED ORDER — LACTATED RINGERS IV SOLN
INTRAVENOUS | Status: DC
  Administered 2016-03-10 (×2): via INTRAVENOUS

## 2016-03-10 MED ORDER — KETOROLAC TROMETHAMINE 30 MG/ML IJ SOLN
INTRAMUSCULAR | Status: DC | PRN
  Administered 2016-03-10: 30 mg via INTRAVENOUS

## 2016-03-10 MED ORDER — KETOROLAC TROMETHAMINE 30 MG/ML IJ SOLN
INTRAMUSCULAR | Status: AC
  Filled 2016-03-10: qty 1

## 2016-03-10 MED ORDER — OXYCODONE HCL 5 MG/5ML PO SOLN: 5.0000 mg | Freq: Once | ORAL | Status: DC | PRN

## 2016-03-10 MED ORDER — MIDAZOLAM HCL 5 MG/5ML IJ SOLN
INTRAMUSCULAR | Status: DC | PRN
  Administered 2016-03-10: 2 mg via INTRAVENOUS

## 2016-03-10 MED ORDER — FENTANYL CITRATE (PF) 100 MCG/2ML IJ SOLN
INTRAMUSCULAR | Status: AC
  Filled 2016-03-10: qty 2

## 2016-03-10 MED ORDER — BUPIVACAINE HCL (PF) 0.25 % IJ SOLN
INTRAMUSCULAR | Status: DC | PRN
  Administered 2016-03-10: 20 mL
  Administered 2016-03-10: 10 mL

## 2016-03-10 MED ORDER — ONDANSETRON HCL 4 MG/2ML IJ SOLN
INTRAMUSCULAR | Status: DC | PRN
  Administered 2016-03-10: 4 mg via INTRAVENOUS

## 2016-03-10 MED ORDER — FENTANYL CITRATE (PF) 100 MCG/2ML IJ SOLN
INTRAMUSCULAR | Status: AC
  Administered 2016-03-10: 50 ug via INTRAVENOUS
  Filled 2016-03-10: qty 2

## 2016-03-10 MED ORDER — ONDANSETRON HCL 4 MG/2ML IJ SOLN
INTRAMUSCULAR | Status: AC
  Filled 2016-03-10: qty 2

## 2016-03-10 MED ORDER — SCOPOLAMINE 1 MG/3DAYS TD PT72
MEDICATED_PATCH | TRANSDERMAL | Status: AC
  Filled 2016-03-10: qty 1

## 2016-03-10 MED ORDER — ACETAMINOPHEN 325 MG PO TABS: 325.0000 mg | ORAL_TABLET | ORAL | Status: DC | PRN

## 2016-03-10 MED ORDER — SUGAMMADEX SODIUM 200 MG/2ML IV SOLN
INTRAVENOUS | Status: AC
  Filled 2016-03-10: qty 2

## 2016-03-10 MED ORDER — BUPIVACAINE HCL (PF) 0.25 % IJ SOLN
INTRAMUSCULAR | Status: AC
  Filled 2016-03-10: qty 30

## 2016-03-10 MED ORDER — FENTANYL CITRATE (PF) 100 MCG/2ML IJ SOLN
25.0000 ug | INTRAMUSCULAR | Status: DC | PRN
  Administered 2016-03-10: 50 ug via INTRAVENOUS

## 2016-03-10 MED ORDER — DEXAMETHASONE SODIUM PHOSPHATE 10 MG/ML IJ SOLN
INTRAMUSCULAR | Status: DC | PRN
  Administered 2016-03-10: 10 mg via INTRAVENOUS

## 2016-03-10 MED ORDER — SODIUM CHLORIDE 0.9 % IJ SOLN
INTRAMUSCULAR | Status: DC | PRN
  Administered 2016-03-10: 10 mL

## 2016-03-10 MED ORDER — LIDOCAINE HCL (CARDIAC) 20 MG/ML IV SOLN
INTRAVENOUS | Status: DC | PRN
  Administered 2016-03-10: 100 mg via INTRAVENOUS

## 2016-03-10 MED ORDER — SCOPOLAMINE 1 MG/3DAYS TD PT72
1.0000 | MEDICATED_PATCH | TRANSDERMAL | Status: DC
  Administered 2016-03-10: 1.5 mg via TRANSDERMAL

## 2016-03-10 MED ORDER — DEXAMETHASONE SODIUM PHOSPHATE 10 MG/ML IJ SOLN
INTRAMUSCULAR | Status: AC
  Filled 2016-03-10: qty 1

## 2016-03-10 MED ORDER — IBUPROFEN 800 MG PO TABS: 800.0000 mg | ORAL_TABLET | Freq: Three times a day (TID) | ORAL | 1 refills | Status: DC | PRN

## 2016-03-10 MED ORDER — MEPERIDINE HCL 25 MG/ML IJ SOLN
6.2500 mg | INTRAMUSCULAR | Status: DC | PRN
  Administered 2016-03-10: 12.5 mg via INTRAVENOUS

## 2016-03-10 MED ORDER — ONDANSETRON HCL 4 MG/2ML IJ SOLN: 4.0000 mg | Freq: Once | INTRAMUSCULAR | Status: DC | PRN

## 2016-03-10 MED ORDER — ROCURONIUM BROMIDE 100 MG/10ML IV SOLN
INTRAVENOUS | Status: AC
  Filled 2016-03-10: qty 1

## 2016-03-10 MED ORDER — MIDAZOLAM HCL 2 MG/2ML IJ SOLN
INTRAMUSCULAR | Status: AC
  Filled 2016-03-10: qty 2

## 2016-03-10 MED ORDER — PROPOFOL 10 MG/ML IV BOLUS
INTRAVENOUS | Status: AC
  Filled 2016-03-10: qty 20

## 2016-03-10 MED ORDER — OXYCODONE-ACETAMINOPHEN 5-325 MG PO TABS: 1.0000 | ORAL_TABLET | Freq: Four times a day (QID) | ORAL | 0 refills | Status: DC | PRN

## 2016-03-10 MED ORDER — KETOROLAC TROMETHAMINE 30 MG/ML IJ SOLN: 30.0000 mg | Freq: Once | INTRAMUSCULAR | Status: DC

## 2016-03-10 MED ORDER — LIDOCAINE HCL (CARDIAC) 20 MG/ML IV SOLN
INTRAVENOUS | Status: AC
  Filled 2016-03-10: qty 5

## 2016-03-10 MED ORDER — FENTANYL CITRATE (PF) 100 MCG/2ML IJ SOLN
INTRAMUSCULAR | Status: DC | PRN
  Administered 2016-03-10: 100 ug via INTRAVENOUS

## 2016-03-10 MED ORDER — MEPERIDINE HCL 25 MG/ML IJ SOLN
INTRAMUSCULAR | Status: AC
  Filled 2016-03-10: qty 1

## 2016-03-10 MED ORDER — SUGAMMADEX SODIUM 200 MG/2ML IV SOLN
INTRAVENOUS | Status: DC | PRN
  Administered 2016-03-10: 200 mg via INTRAVENOUS

## 2016-03-10 MED ORDER — PROPOFOL 10 MG/ML IV BOLUS
INTRAVENOUS | Status: DC | PRN
  Administered 2016-03-10: 200 mg via INTRAVENOUS

## 2016-03-10 MED ORDER — ACETAMINOPHEN 160 MG/5ML PO SOLN: 325.0000 mg | ORAL | Status: DC | PRN

## 2016-03-10 SURGICAL SUPPLY — 28 items
CATH ROBINSON RED A/P 16FR (CATHETERS) ×2 IMPLANT
CLOTH BEACON ORANGE TIMEOUT ST (SAFETY) ×2 IMPLANT
DECANTER SPIKE VIAL GLASS SM (MISCELLANEOUS) ×4 IMPLANT
DRSG OPSITE POSTOP 3X4 (GAUZE/BANDAGES/DRESSINGS) ×4 IMPLANT
DURAPREP 26ML APPLICATOR (WOUND CARE) ×2 IMPLANT
GLOVE BIO SURGEON STRL SZ 6.5 (GLOVE) ×2 IMPLANT
GLOVE BIOGEL PI IND STRL 7.0 (GLOVE) ×1 IMPLANT
GLOVE BIOGEL PI INDICATOR 7.0 (GLOVE) ×1
GOWN STRL REUS W/TWL LRG LVL3 (GOWN DISPOSABLE) ×4 IMPLANT
LIQUID BAND (GAUZE/BANDAGES/DRESSINGS) ×2 IMPLANT
NEEDLE INSUFFLATION 120MM (ENDOMECHANICALS) ×2 IMPLANT
PACK LAPAROSCOPY BASIN (CUSTOM PROCEDURE TRAY) ×2 IMPLANT
PACK TRENDGUARD 450 HYBRID PRO (MISCELLANEOUS) ×1 IMPLANT
PACK TRENDGUARD 600 HYBRD PROC (MISCELLANEOUS) IMPLANT
POUCH SPECIMEN RETRIEVAL 10MM (ENDOMECHANICALS) IMPLANT
PROTECTOR NERVE ULNAR (MISCELLANEOUS) ×4 IMPLANT
SHEARS HARMONIC ACE PLUS 36CM (ENDOMECHANICALS) ×2 IMPLANT
SLEEVE XCEL OPT CAN 5 100 (ENDOMECHANICALS) ×2 IMPLANT
SUT VIC AB 3-0 CTX 36 (SUTURE) IMPLANT
SUT VIC AB 3-0 PS2 18 (SUTURE) ×1
SUT VIC AB 3-0 PS2 18XBRD (SUTURE) ×1 IMPLANT
SUT VICRYL 0 UR6 27IN ABS (SUTURE) IMPLANT
TOWEL OR 17X24 6PK STRL BLUE (TOWEL DISPOSABLE) ×4 IMPLANT
TRENDGUARD 450 HYBRID PRO PACK (MISCELLANEOUS) ×2
TRENDGUARD 600 HYBRID PROC PK (MISCELLANEOUS)
TROCAR XCEL NON-BLD 11X100MML (ENDOMECHANICALS) ×2 IMPLANT
WARMER LAPAROSCOPE (MISCELLANEOUS) ×2 IMPLANT
WATER STERILE IRR 1000ML POUR (IV SOLUTION) ×2 IMPLANT

## 2016-03-10 NOTE — Discharge Instructions (Signed)

## 2016-03-10 NOTE — Transfer of Care (Signed)
Immediate Anesthesia Transfer of Care Note  Patient: Christine Mercer  Procedure(s) Performed: Procedure(s): LAPAROSCOPIC TUBAL LIGATION (Bilateral)  Patient Location: PACU  Anesthesia Type:General  Level of Consciousness: awake, alert  and oriented  Airway & Oxygen Therapy: Patient Spontanous Breathing and Patient connected to nasal cannula oxygen  Post-op Assessment: Report given to RN and Post -op Vital signs reviewed and stable  Post vital signs: Reviewed and stable  Last Vitals:  Vitals:   03/10/16 1043  BP: 128/79  Pulse: 66  Resp: 18  Temp: 36.6 C    Last Pain:  Vitals:   03/10/16 1043  TempSrc: Oral      Patients Stated Pain Goal: 3 (03/10/16 1043)  Complications: No apparent anesthesia complications

## 2016-03-10 NOTE — Anesthesia Preprocedure Evaluation (Addendum)
Anesthesia Evaluation  Patient identified by MRN, date of birth, ID band Patient awake    Reviewed: Allergy & Precautions, H&P , NPO status , Patient's Chart, lab work & pertinent test results  Airway Mallampati: I  TM Distance: >3 FB Neck ROM: full    Dental  (+) Teeth Intact   Pulmonary neg pulmonary ROS,    breath sounds clear to auscultation       Cardiovascular negative cardio ROS Normal cardiovascular exam     Neuro/Psych negative neurological ROS  negative psych ROS   GI/Hepatic negative GI ROS, Neg liver ROS,   Endo/Other  Hypothyroidism   Renal/GU negative Renal ROS     Musculoskeletal   Abdominal (+) + obese,   Peds  Hematology negative hematology ROS (+)   Anesthesia Other Findings   Reproductive/Obstetrics negative OB ROS                            Anesthesia Physical Anesthesia Plan  ASA: II  Anesthesia Plan: Epidural   Post-op Pain Management:    Induction: Intravenous  Airway Management Planned: Oral ETT  Additional Equipment:   Intra-op Plan:   Post-operative Plan: Extubation in OR  Informed Consent: I have reviewed the patients History and Physical, chart, labs and discussed the procedure including the risks, benefits and alternatives for the proposed anesthesia with the patient or authorized representative who has indicated his/her understanding and acceptance.   Dental Advisory Given  Plan Discussed with: CRNA and Surgeon  Anesthesia Plan Comments:         Anesthesia Quick Evaluation

## 2016-03-10 NOTE — Interval H&P Note (Signed)
History and Physical Interval Note:  03/10/2016 11:00 AM  Susanne GreenhouseKayla Breidenbach  has presented today for surgery, with the diagnosis of Sterilization  The various methods of treatment have been discussed with the patient and family. After consideration of risks, benefits and other options for treatment, the patient has consented to  Procedure(s): LAPAROSCOPIC TUBAL LIGATION (Bilateral) as a surgical intervention .  The patient's history has been reviewed, patient examined, no change in status, stable for surgery.  I have reviewed the patient's chart and labs.  Questions were answered to the patient's satisfaction.     Bovard-Stuckert, Ernesha Ramone

## 2016-03-10 NOTE — Anesthesia Procedure Notes (Signed)
Procedure Name: Intubation Date/Time: 03/10/2016 11:45 AM Performed by: Junious SilkGILBERT, Shalinda Burkholder Pre-anesthesia Checklist: Patient identified, Emergency Drugs available, Suction available, Patient being monitored and Timeout performed Patient Re-evaluated:Patient Re-evaluated prior to inductionOxygen Delivery Method: Circle system utilized Preoxygenation: Pre-oxygenation with 100% oxygen Intubation Type: IV induction Ventilation: Mask ventilation without difficulty Laryngoscope Size: Miller and 2 Grade View: Grade I Tube type: Oral Tube size: 7.0 mm Number of attempts: 1 Airway Equipment and Method: Stylet Placement Confirmation: ETT inserted through vocal cords under direct vision,  positive ETCO2,  CO2 detector and breath sounds checked- equal and bilateral Secured at: 21 cm Tube secured with: Tape Dental Injury: Teeth and Oropharynx as per pre-operative assessment

## 2016-03-10 NOTE — Anesthesia Postprocedure Evaluation (Signed)
Anesthesia Post Note  Patient: Christine Mercer  Procedure(s) Performed: Procedure(s) (LRB): LAPAROSCOPIC TUBAL LIGATION (Bilateral)  Patient location during evaluation: PACU Anesthesia Type: General Level of consciousness: awake and alert Pain management: pain level controlled Vital Signs Assessment: post-procedure vital signs reviewed and stable Respiratory status: spontaneous breathing, nonlabored ventilation, respiratory function stable and patient connected to nasal cannula oxygen Cardiovascular status: blood pressure returned to baseline and stable Postop Assessment: no signs of nausea or vomiting Anesthetic complications: no     Last Vitals:  Vitals:   03/10/16 1330 03/10/16 1345  BP: 115/64 109/69  Pulse: (!) 47 (!) 48  Resp: 14 16  Temp:      Last Pain:  Vitals:   03/10/16 1345  TempSrc:   PainSc: 3    Pain Goal: Patients Stated Pain Goal: 3 (03/10/16 1043)               Shelton SilvasKevin D Rebekkah Powless

## 2016-03-10 NOTE — Brief Op Note (Signed)
03/10/2016  12:48 PM  PATIENT:  Christine Mercer  25 y.o. female  PRE-OPERATIVE DIAGNOSIS:  Sterilization  POST-OPERATIVE DIAGNOSIS:  desires sterilization  PROCEDURE:  Procedure(s): LAPAROSCOPIC TUBAL LIGATION (Bilateral) by fulguration  SURGEON:  Surgeon(s) and Role:    * Sherian ReinJody Bovard-Stuckert, MD - Primary  ANESTHESIA:   general  EBL:  Total I/O In: 1200 [I.V.:1200] Out: 110 [Urine:100; Blood:10]  BLOOD ADMINISTERED:none  DRAINS: none   LOCAL MEDICATIONS USED:  MARCAINE     SPECIMEN:  No Specimen  DISPOSITION OF SPECIMEN:  N/A  COUNTS:  YES  TOURNIQUET:  * No tourniquets in log *  DICTATION: .Other Dictation: Dictation Number [id number]  PLAN OF CARE: Discharge to home after PACU  PATIENT DISPOSITION:  PACU - hemodynamically stable.   Delay start of Pharmacological VTE agent (>24hrs) due to surgical blood loss or risk of bleeding: not applicable

## 2016-03-11 ENCOUNTER — Encounter (HOSPITAL_COMMUNITY): Payer: Self-pay | Admitting: Obstetrics and Gynecology

## 2016-03-11 NOTE — Op Note (Deleted)
  The note originally documented on this encounter has been moved the the encounter in which it belongs.  

## 2016-03-11 NOTE — Op Note (Signed)
NAMCloyd Stagers:  Christine Mercer, Christine Mercer                ACCOUNT NO.:  [id number]  MEDICAL RECORD NO.:  [id number]  LOCATION:  [facility]                           FACILITY:  [facility]  PHYSICIAN:  Sherron MondayJody Bovard, MD        DATE OF BIRTH:  Nov 07, 1990  DATE OF PROCEDURE:  03/10/2016 DATE OF DISCHARGE:  03/06/2016                              OPERATIVE REPORT   PREOPERATIVE DIAGNOSIS:  Status post vaginal delivery.  Desires permanent sterilization with immature 30-day papers.  POSTOPERATIVE DIAGNOSIS:  Status post vaginal delivery.  Desires permanent sterilization with immature 30-day papers, status post tubal ligation.  PROCEDURE:  Bilateral tubal ligation via fulguration.  ANESTHESIA:  General.  IV FLUIDS:  1200 mL.  URINE OUTPUT:  100 mL.  Clear urine at the end of the procedure.  EBL:  Approximately 10 mL.  COMPLICATIONS:  None.  PATHOLOGY:  None.  DETAILS OF PROCEDURE:  After informed consent was reviewed with the patient including risks, benefits, and alternatives of the surgical procedure, she was transported to the operating room, and placed on the table in a supine position.  She was then placed in the [place] stirrups, prepped and draped in the normal sterile fashion.  Her bladder was sterilely drained.  Using an open-sided speculum, a Hulka tenaculum was placed in her uterus.  Gloves were changed.  Attention turned to the abdominal portion of the case.  A vertical infraumbilical incision was made.  Using the Veress needle, pneumoperitoneum was obtained after passing the hanging drop test.  A 10 trocar was placed under direct visualization and a brief pelvic survey performed, which revealed normal bilateral tubes and ovaries and a postpartum uterus.  The tubes were identified, followed out to the fimbriated end.  A portion of approximately 2 cm was fulgurated bilaterally. The trocar was used for gas evacuation from the abdomen.  She tolerated the procedure well.  The incision was closed with suture  of deep 0 Vicryl to reapproximate the fascia.  The deep space was closed with 0 Vicryl.  The skin was closed with 3-0 Vicryl in a subcu fashion.  Bandage was applied after the glue had been applied to her incision.  Sponge, lap and needle counts were correct x2 per the operating staff.     Sherron MondayJody Bovard, MD     JB/MEDQ  D:  03/10/2016  T:  03/11/2016  Job:  [id number]

## 2016-05-29 DIAGNOSIS — Z0000 Encounter for general adult medical examination without abnormal findings: Secondary | ICD-10-CM | POA: Insufficient documentation

## 2016-08-06 DIAGNOSIS — J019 Acute sinusitis, unspecified: Secondary | ICD-10-CM | POA: Insufficient documentation

## 2016-08-06 DIAGNOSIS — N926 Irregular menstruation, unspecified: Secondary | ICD-10-CM | POA: Insufficient documentation

## 2017-12-15 ENCOUNTER — Ambulatory Visit (HOSPITAL_COMMUNITY)
Admission: RE | Admit: 2017-12-15 | Discharge: 2017-12-15 | Disposition: A | Discharge status: Home / Self Care | Payer: BLUE CROSS/BLUE SHIELD | Source: Ambulatory Visit | Attending: Cardiology | Admitting: Cardiology

## 2017-12-15 ENCOUNTER — Other Ambulatory Visit (HOSPITAL_COMMUNITY): Payer: Self-pay | Admitting: Orthopedic Surgery

## 2017-12-15 ENCOUNTER — Encounter (HOSPITAL_COMMUNITY): Payer: Self-pay

## 2017-12-15 DIAGNOSIS — R609 Edema, unspecified: Secondary | ICD-10-CM

## 2017-12-15 DIAGNOSIS — R52 Pain, unspecified: Secondary | ICD-10-CM

## 2017-12-15 NOTE — Progress Notes (Unsigned)
Today's right lower extremity venous duplex is negative for DVT. Preliminary results given to New DouglasJosha, PA at 12:38.

## 2020-09-10 ENCOUNTER — Other Ambulatory Visit: Payer: Self-pay

## 2020-09-10 ENCOUNTER — Ambulatory Visit: Payer: Self-pay

## 2020-09-10 ENCOUNTER — Other Ambulatory Visit: Payer: Self-pay | Admitting: Family Medicine

## 2020-09-10 DIAGNOSIS — M25512 Pain in left shoulder: Secondary | ICD-10-CM

## 2021-03-12 ENCOUNTER — Encounter (HOSPITAL_COMMUNITY): Payer: Self-pay

## 2021-03-12 ENCOUNTER — Other Ambulatory Visit: Payer: Self-pay

## 2021-03-12 ENCOUNTER — Ambulatory Visit (HOSPITAL_COMMUNITY)
Admission: EM | Admit: 2021-03-12 | Discharge: 2021-03-12 | Disposition: A | Discharge status: Home / Self Care | Payer: Managed Care, Other (non HMO) | Attending: Emergency Medicine | Admitting: Emergency Medicine

## 2021-03-12 DIAGNOSIS — J111 Influenza due to unidentified influenza virus with other respiratory manifestations: Secondary | ICD-10-CM | POA: Diagnosis not present

## 2021-03-12 MED ORDER — OSELTAMIVIR PHOSPHATE 75 MG PO CAPS: 75.0000 mg | ORAL_CAPSULE | Freq: Two times a day (BID) | ORAL | 0 refills | Status: DC

## 2021-03-12 NOTE — ED Provider Notes (Signed)
[facility]    CSN: [id number] Arrival date & time: 03/12/21  1913      History   Chief Complaint Chief Complaint  Patient presents with   Sore Throat   Chills   Fever    HPI Christine Mercer is a 30 y.o. female.   Patient here for evaluation of chills, sore throat, and fever that started yesterday.  Reports daughter tested positive for flu a today.  Reports using OTC medications with minimal symptom relief.  Denies any trauma, injury, or other precipitating event.  Denies any specific alleviating or aggravating factors.  Denies any chest pain, shortness of breath, N/V/D, numbness, tingling, weakness, abdominal pain, or headaches.    The history is provided by the patient.  Sore Throat  Fever Associated symptoms: congestion, myalgias and sore throat    Past Medical History:  Diagnosis Date   Hypothyroidism    Normal pregnancy in multigravida in third trimester 02/11/2016   Normal pregnancy, first 07/25/2011   S/P tubal ligation 03/10/2016   SVD (spontaneous vaginal delivery) 07/25/2011   Thyroid disease    hypothyroid    Patient Active Problem List   Diagnosis Date Noted   S/P tubal ligation 03/10/2016   SVD (spontaneous vaginal delivery) 02/12/2016   Normal pregnancy in multigravida in third trimester 02/11/2016    Past Surgical History:  Procedure Laterality Date   LAPAROSCOPIC TUBAL LIGATION Bilateral 03/10/2016   Procedure: LAPAROSCOPIC TUBAL LIGATION;  Surgeon: Sherian Rein, MD;  Location: [facility];  Service: Gynecology;  Laterality: Bilateral;   NO PAST SURGERIES     TUBAL LIGATION      OB History     Gravida  3   Para  3   Term  3   Preterm  0   AB  0   Living  3      SAB  0   IAB  0   Ectopic  0   Multiple  0   Live Births  3            Home Medications    Prior to Admission medications   Medication Sig Start Date End Date Taking? Authorizing Provider  oseltamivir (TAMIFLU) 75 MG capsule Take 1 capsule (75 mg  total) by mouth every 12 (twelve) hours. 03/12/21  Yes Ivette Loyal, NP  ibuprofen (ADVIL,MOTRIN) 800 MG tablet Take 1 tablet (800 mg total) by mouth every 8 (eight) hours as needed for moderate pain. 03/10/16   Bovard-Stuckert, Augusto Gamble, MD  levothyroxine (SYNTHROID, LEVOTHROID) 112 MCG tablet Take 112 mcg by mouth daily before breakfast.    [provider]  liothyronine (CYTOMEL) 5 MCG tablet Take 5 mcg by mouth daily before breakfast.    [provider]  oxyCODONE-acetaminophen (ROXICET) 5-325 MG tablet Take 1-2 tablets by mouth every 6 (six) hours as needed for severe pain. 03/10/16   Bovard-Stuckert, Augusto Gamble, MD    Family History Family History  Problem Relation Age of Onset   Diabetes Mother    Hypertension Mother    Hypertension Father    Hypertension Brother     Social History Social History   Tobacco Use   Smoking status: Never   Smokeless tobacco: Never  Substance Use Topics   Alcohol use: No    Alcohol/week: 0.0 standard drinks   Drug use: No     Allergies   Patient has no known allergies.   Review of Systems Review of Systems  Constitutional:  Positive for fatigue and fever.  HENT:  Positive for congestion and sore throat.   Musculoskeletal:  Positive for myalgias.  All other systems reviewed and are negative.   Physical Exam Triage Vital Signs ED Triage Vitals  Enc Vitals Group     BP 03/12/21 1953 135/87     Pulse Rate 03/12/21 1953 (!) 106     Resp --      Temp 03/12/21 1953 (!) 100.6 F (38.1 C)     Temp Source 03/12/21 1953 Oral     SpO2 03/12/21 1953 97 %     Weight --      Height --      Head Circumference --      Peak Flow --      Pain Score 03/12/21 1950 8     Pain Loc --      Pain Edu? --      Excl. in GC? --    No data found.  Updated Vital Signs BP 135/87 (BP Location: Left Arm)   Pulse (!) 106   Temp (!) 100.6 F (38.1 C) (Oral)   LMP 03/02/2021 (Exact Date)   SpO2 97%   Visual Acuity Right Eye Distance:    Left Eye Distance:   Bilateral Distance:    Right Eye Near:   Left Eye Near:    Bilateral Near:     Physical Exam Vitals and nursing note reviewed.  Constitutional:      General: She is not in acute distress.    Appearance: Normal appearance. She is not ill-appearing, toxic-appearing or diaphoretic.  HENT:     Head: Normocephalic and atraumatic.     Nose: Congestion and rhinorrhea present.     Mouth/Throat:     Pharynx: Uvula midline. Pharyngeal swelling and posterior oropharyngeal erythema present. No oropharyngeal exudate or uvula swelling.     Tonsils: No tonsillar exudate or tonsillar abscesses.  Eyes:     Conjunctiva/sclera: Conjunctivae normal.  Cardiovascular:     Rate and Rhythm: Normal rate and regular rhythm.     Pulses: Normal pulses.     Heart sounds: Normal heart sounds.  Pulmonary:     Effort: Pulmonary effort is normal.     Breath sounds: Normal breath sounds.  Abdominal:     General: Abdomen is flat.     Palpations: Abdomen is soft.  Musculoskeletal:        General: Normal range of motion.     Cervical back: Normal range of motion.  Skin:    General: Skin is warm and dry.  Neurological:     General: No focal deficit present.     Mental Status: She is alert and oriented to person, place, and time.  Psychiatric:        Mood and Affect: Mood normal.     UC Treatments / Results  Labs (all labs ordered are listed, but only abnormal results are displayed) Labs Reviewed - No data to display  EKG   Radiology No results found.  Procedures Procedures (including critical care time)  Medications Ordered in UC Medications - No data to display  Initial Impression / Assessment and Plan / UC Course  I have reviewed the triage vital signs and the nursing notes.  Pertinent labs & imaging results that were available during my care of the patient were reviewed by me and considered in my medical decision making (see chart for details).    Assessment  negative for red flags or concerns.  As patient tested positive for the flu today this is  likely the flu.  Will prescribe Tamiflu 1 pill twice a day for the next 5 days as this can also be used prophylactically.  Encourage fluids and rest.  Tylenol and/or ibuprofen as needed.  Discussed conservative symptom management as described in discharge instructions.  Follow-up as needed. Final Clinical Impressions(s) / UC Diagnoses   Final diagnoses:  Flu     Discharge Instructions      Take the Tamiflu 1 pill twice a day for the next 5 days.  You can take Tylenol and/or Ibuprofen as needed for fever reduction and pain relief.   For cough: honey 1/2 to 1 teaspoon (you can dilute the honey in water or another fluid).  You can also use guaifenesin and dextromethorphan for cough. You can use a humidifier for chest congestion and cough.  If you don't have a humidifier, you can sit in the bathroom with the hot shower running.     For sore throat: try warm salt water gargles, cepacol lozenges, throat spray, warm tea or water with lemon/honey, popsicles or ice, or OTC cold relief medicine for throat discomfort.    For congestion: take a daily anti-histamine like Zyrtec, Claritin, and a oral decongestant, such as pseudoephedrine.  You can also use Flonase 1-2 sprays in each nostril daily.    It is important to stay hydrated: drink plenty of fluids (water, gatorade/powerade/pedialyte, juices, or teas) to keep your throat moisturized and help further relieve irritation/discomfort.   Return or go to the Emergency Department if symptoms worsen or do not improve in the next few days.      ED Prescriptions     Medication Sig Dispense Auth. Provider   oseltamivir (TAMIFLU) 75 MG capsule Take 1 capsule (75 mg total) by mouth every 12 (twelve) hours. 10 capsule Ivette Loyal, NP      PDMP not reviewed this encounter.   Ivette Loyal, NP 03/12/21 2022

## 2021-03-12 NOTE — ED Triage Notes (Signed)
Patient here with chills, sore throat and fever. Symptoms started yesterday afternoon. Child tested positive for FLU A.

## 2021-03-12 NOTE — Discharge Instructions (Signed)
Take the Tamiflu 1 pill twice a day for the next 5 days.  You can take Tylenol and/or Ibuprofen as needed for fever reduction and pain relief.   For cough: honey 1/2 to 1 teaspoon (you can dilute the honey in water or another fluid).  You can also use guaifenesin and dextromethorphan for cough. You can use a humidifier for chest congestion and cough.  If you don't have a humidifier, you can sit in the bathroom with the hot shower running.     For sore throat: try warm salt water gargles, cepacol lozenges, throat spray, warm tea or water with lemon/honey, popsicles or ice, or OTC cold relief medicine for throat discomfort.    For congestion: take a daily anti-histamine like Zyrtec, Claritin, and a oral decongestant, such as pseudoephedrine.  You can also use Flonase 1-2 sprays in each nostril daily.    It is important to stay hydrated: drink plenty of fluids (water, gatorade/powerade/pedialyte, juices, or teas) to keep your throat moisturized and help further relieve irritation/discomfort.   Return or go to the Emergency Department if symptoms worsen or do not improve in the next few days.

## 2023-11-18 ENCOUNTER — Encounter: Payer: Self-pay | Admitting: Family Medicine

## 2023-11-18 ENCOUNTER — Ambulatory Visit: Admitting: Family Medicine

## 2023-11-18 VITALS — BP 124/82 | HR 79 | Temp 98.3°F | Ht 66.0 in | Wt 213.1 lb

## 2023-11-18 DIAGNOSIS — R5383 Other fatigue: Secondary | ICD-10-CM | POA: Diagnosis not present

## 2023-11-18 DIAGNOSIS — E039 Hypothyroidism, unspecified: Secondary | ICD-10-CM | POA: Diagnosis not present

## 2023-11-18 DIAGNOSIS — Z7689 Persons encountering health services in other specified circumstances: Secondary | ICD-10-CM

## 2023-11-18 DIAGNOSIS — M545 Low back pain, unspecified: Secondary | ICD-10-CM | POA: Insufficient documentation

## 2023-11-18 LAB — CBC WITH DIFFERENTIAL/PLATELET
Absolute Lymphocytes: 2440 {cells}/uL (ref 850–3900)
Absolute Monocytes: 730 {cells}/uL (ref 200–950)
Basophils Absolute: 30 {cells}/uL (ref 0–200)
Basophils Relative: 0.3 %
Eosinophils Absolute: 350 {cells}/uL (ref 15–500)
Eosinophils Relative: 3.5 %
HCT: 40.2 % (ref 35.0–45.0)
Hemoglobin: 13 g/dL (ref 11.7–15.5)
MCH: 30.4 pg (ref 27.0–33.0)
MCHC: 32.3 g/dL (ref 32.0–36.0)
MCV: 94.1 fL (ref 80.0–100.0)
MPV: 10.9 fL (ref 7.5–12.5)
Monocytes Relative: 7.3 %
Neutro Abs: 6450 {cells}/uL (ref 1500–7800)
Neutrophils Relative %: 64.5 %
Platelets: 273 Thousand/uL (ref 140–400)
RBC: 4.27 Million/uL (ref 3.80–5.10)
RDW: 12.9 % (ref 11.0–15.0)
Total Lymphocyte: 24.4 %
WBC: 10 Thousand/uL (ref 3.8–10.8)

## 2023-11-18 LAB — COMPREHENSIVE METABOLIC PANEL WITH GFR
AG Ratio: 2.2 (calc) (ref 1.0–2.5)
ALT: 17 U/L (ref 6–29)
AST: 15 U/L (ref 10–30)
Albumin: 4.3 g/dL (ref 3.6–5.1)
Alkaline phosphatase (APISO): 54 U/L (ref 31–125)
BUN: 13 mg/dL (ref 7–25)
CO2: 26 mmol/L (ref 20–32)
Calcium: 9.6 mg/dL (ref 8.6–10.2)
Chloride: 109 mmol/L (ref 98–110)
Creat: 0.82 mg/dL (ref 0.50–0.97)
Globulin: 2 g/dL (ref 1.9–3.7)
Glucose, Bld: 96 mg/dL (ref 65–99)
Potassium: 4.6 mmol/L (ref 3.5–5.3)
Sodium: 140 mmol/L (ref 135–146)
Total Bilirubin: 0.4 mg/dL (ref 0.2–1.2)
Total Protein: 6.3 g/dL (ref 6.1–8.1)
eGFR: 98 mL/min/1.73m2

## 2023-11-18 LAB — TSH: TSH: 4.49 m[IU]/L

## 2023-11-18 LAB — IRON,TIBC AND FERRITIN PANEL
%SAT: 10 % — ABNORMAL LOW (ref 16–45)
Ferritin: 26 ng/mL (ref 16–154)
Iron: 35 ug/dL — ABNORMAL LOW (ref 40–190)
TIBC: 342 ug/dL (ref 250–450)

## 2023-11-18 LAB — VITAMIN B12: Vitamin B-12: 326 pg/mL (ref 200–1100)

## 2023-11-18 LAB — VITAMIN D 25 HYDROXY (VIT D DEFICIENCY, FRACTURES): Vit D, 25-Hydroxy: 26 ng/mL — ABNORMAL LOW (ref 30–100)

## 2023-11-18 MED ORDER — SYNTHROID 125 MCG PO TABS: 125.0000 ug | ORAL_TABLET | Freq: Every day | ORAL | 1 refills | Status: DC

## 2023-11-18 NOTE — Progress Notes (Signed)
 Patient Office Visit  Assessment & Plan:  Encounter to establish care  Acquired hypothyroidism -     TSH  Other fatigue -     CBC with Differential/Platelet -     Comprehensive metabolic panel with GFR -     VITAMIN D 25 Hydroxy (Vit-D Deficiency, Fractures) -     Vitamin B12 -     Iron, TIBC and Ferritin Panel  Other orders -     Synthroid ; Take 1 tablet (125 mcg total) by mouth daily before breakfast.  Dispense: 90 tablet; Refill: 1   Assessment and Plan    Hypothyroidism Off thyroid medication for ten months. Prefers Synthroid  over generic due to dissatisfaction. - Order thyroid function tests. - Prescribe Synthroid  125 mcg with 'dispense as written'.  Fatigue Fatigue likely due to lifestyle. Consider iron or nutritional deficiencies. - Order blood tests for iron levels, B12, and other markers.  General Health Maintenance Due for Pap smear and physical examination. - Advise scheduling Pap smear with OBGYN. - Recommend scheduling a future physical examination.       No follow-ups on file.   Subjective:    Patient ID: Christine Mercer, female    DOB: [birth year]  Age: 33 y.o. MRN: [id number]  Chief Complaint  Patient presents with   Medical Management of Chronic Issues   Establish Care    HPI Discussed the use of AI scribe software for clinical note transcription with the patient, who gave verbal consent to proceed.  History of Present Illness        Christine Mercer is a 33 year old female with hypothyroidism who presents for a medication refill. Patient is here to establish care  She has been off her thyroid medication, levothyroxine  125 mcg, for approximately ten months due to difficulty scheduling an appointment. She experiences fatigue, which she attributes to her busy lifestyle with three children aged 49, 39, and 38.  Her weight has decreased slightly from 217 pounds in 2023 to 213 pounds currently. She recalls weighing 230 pounds in 2017, attributing the  weight to having a child that year. 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 Her husband packs sandwiches for her work, but she often does not eat until the evening.  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 She wants to lose weight but having a hard time dong so.  Feeling fatigued but has not been anemic or iron deficient in the past.  She has been with the same partner for eleven years and has no concerns about sexually transmitted infections. She is up to date on Pap smears and plans to schedule an appointment with her OBGYN soon. Physical Exam MEASUREMENTS: Weight- 213. CARDIOVASCULAR: Heart regular rate and rhythm. Results Assessment & Plan Hypothyroidism Off thyroid medication for ten months. Prefers Synthroid  over generic due to dissatisfaction. - Order thyroid function tests. - Prescribe Synthroid  125 mcg with 'dispense as written'.  Fatigue Fatigue likely due to lifestyle. Consider iron or nutritional deficiencies. - Order blood tests for iron levels, B12, and other markers.  General Health Maintenance Due for Pap smear and physical examination. - Advise scheduling Pap smear with OBGYN. - Recommend scheduling a future physical examination.    The ASCVD Risk score (Arnett DK, et al., 2019) failed to calculate for the following reasons:   The  2019 ASCVD risk score is only valid for ages 47 to 27  Past Medical History:  Diagnosis Date   Hypothyroidism    Normal pregnancy in multigravida in third trimester 02/11/2016   Normal pregnancy, first 07/25/2011   S/P tubal ligation 03/10/2016   SVD (spontaneous vaginal delivery) 07/25/2011   Thyroid disease    hypothyroid   Past Surgical History:  Procedure Laterality Date   LAPAROSCOPIC TUBAL LIGATION Bilateral 03/10/2016   Procedure: LAPAROSCOPIC  TUBAL LIGATION;  Surgeon: Ezzie Buba, MD;  Location: [facility];  Service: Gynecology;  Laterality: Bilateral;   Social History   Tobacco Use   Smoking status: Never   Smokeless tobacco: Never  Substance Use Topics   Alcohol use: No    Alcohol/week: 0.0 standard drinks of alcohol   Drug use: No   Family History  Problem Relation Age of Onset   Diabetes Mother    Hypertension Mother    Hypertension Father    Hypertension Brother    No Known Allergies  ROS    Objective:    BP 124/82   Pulse 79   Temp 98.3 F (36.8 C)   Ht 5' 6 (1.676 m)   Wt 213 lb 2 oz (96.7 kg)   LMP 10/28/2023 (Approximate)   SpO2 99%   BMI 34.40 kg/m  BP Readings from Last 3 Encounters:  11/18/23 124/82  03/12/21 135/87  03/10/16 109/69   Wt Readings from Last 3 Encounters:  11/18/23 213 lb 2 oz (96.7 kg)  03/06/16 207 lb 8 oz (94.1 kg)  02/12/16 230 lb (104.3 kg)    Physical Exam Vitals and nursing note reviewed.  Constitutional:      Appearance: Normal appearance.  HENT:     Head: Normocephalic.     Right Ear: Tympanic membrane, ear canal and external ear normal.     Left Ear: Tympanic membrane, ear canal and external ear normal.  Eyes:     Extraocular Movements: Extraocular movements intact.     Conjunctiva/sclera: Conjunctivae normal.     Pupils: Pupils are equal, round, and reactive to light.  Cardiovascular:     Rate and Rhythm: Normal rate and regular rhythm.     Heart sounds: Normal heart sounds.  Pulmonary:     Effort: Pulmonary effort is normal.     Breath sounds: Normal breath sounds.  Musculoskeletal:     Right lower leg: No edema.     Left lower leg: No edema.  Neurological:     General: No focal deficit present.     Mental Status: She is alert and oriented to person, place, and time.  Psychiatric:        Mood and Affect: Mood normal.        Behavior: Behavior normal.        Thought Content: Thought content normal.        Judgment: Judgment normal.       No results found for any visits on 11/18/23.

## 2023-11-23 ENCOUNTER — Ambulatory Visit: Payer: Self-pay | Admitting: Family Medicine

## 2023-11-24 ENCOUNTER — Other Ambulatory Visit: Payer: Self-pay

## 2023-11-24 MED ORDER — LEVOTHYROXINE SODIUM 125 MCG PO TABS: 125.0000 ug | ORAL_TABLET | Freq: Every day | ORAL | 3 refills | Status: DC

## 2023-11-24 MED ORDER — LEVOTHYROXINE SODIUM 25 MCG PO TABS: 25.0000 ug | ORAL_TABLET | Freq: Every day | ORAL | 3 refills | Status: DC

## 2024-01-05 ENCOUNTER — Encounter: Payer: Self-pay | Admitting: Family Medicine

## 2024-01-05 ENCOUNTER — Ambulatory Visit: Admitting: Family Medicine

## 2024-01-05 VITALS — BP 138/74 | HR 68 | Temp 97.9°F | Ht 66.0 in | Wt 209.6 lb

## 2024-01-05 DIAGNOSIS — J019 Acute sinusitis, unspecified: Secondary | ICD-10-CM | POA: Diagnosis not present

## 2024-01-05 DIAGNOSIS — D509 Iron deficiency anemia, unspecified: Secondary | ICD-10-CM

## 2024-01-05 DIAGNOSIS — E039 Hypothyroidism, unspecified: Secondary | ICD-10-CM | POA: Diagnosis not present

## 2024-01-05 MED ORDER — AMOXICILLIN 875 MG PO TABS: 875.0000 mg | ORAL_TABLET | Freq: Two times a day (BID) | ORAL | 0 refills | Status: AC

## 2024-01-05 MED ORDER — PREDNISONE 10 MG (21) PO TBPK: ORAL_TABLET | ORAL | 0 refills | Status: AC

## 2024-01-05 MED ORDER — BENZONATATE 100 MG PO CAPS: 100.0000 mg | ORAL_CAPSULE | Freq: Three times a day (TID) | ORAL | 0 refills | Status: AC | PRN

## 2024-01-05 MED ORDER — LEVOTHYROXINE SODIUM 25 MCG PO TABS
25.0000 ug | ORAL_TABLET | Freq: Every day | ORAL | 3 refills | Status: AC
Start: 2024-01-05 — End: ?

## 2024-01-05 MED ORDER — HYDROCODONE BIT-HOMATROP MBR 5-1.5 MG/5ML PO SOLN: 5.0000 mL | Freq: Three times a day (TID) | ORAL | 0 refills | Status: AC | PRN

## 2024-01-05 NOTE — Progress Notes (Signed)
 Patient Office Visit  Assessment & Plan:  Acute non-recurrent sinusitis, unspecified location -     HYDROcodone  Bit-Homatrop MBr; Take 5 mLs by mouth every 8 (eight) hours as needed for cough. Or take at night time as needed for cough  Dispense: 120 mL; Refill: 0 -     Benzonatate ; Take 1 capsule (100 mg total) by mouth 3 (three) times daily as needed.  Dispense: 30 capsule; Refill: 0  Acquired hypothyroidism -     Levothyroxine  Sodium; Take 1 tablet (25 mcg total) by mouth daily.  Dispense: 90 tablet; Refill: 3  Iron deficiency anemia, unspecified iron deficiency anemia type -     CBC with Differential/Platelet -     TSH -     VITAMIN D  25 Hydroxy (Vit-D Deficiency, Fractures) -     Vitamin B12 -     Iron, TIBC and Ferritin Panel  Other orders -     Amoxicillin ; Take 1 tablet (875 mg total) by mouth 2 (two) times daily for 10 days.  Dispense: 20 tablet; Refill: 0 -     predniSONE ; Use as directed.  Dispense: 21 each; Refill: 0   Assessment and Plan    Acute sinusitis with upper respiratory symptoms Acute sinusitis with sinus pressure, nasal congestion, and post-nasal drainage. Differential includes possible COVID-19, but no fever or severe respiratory symptoms. Lungs clear. - prescription of amoxicillin  for sinusitis. - Prescribe prednisone  for sinus pressure and congestion, discussed potential hyperactivity and sleep disturbances. - Recommend Flonase for nasal congestion. - Prescribe Tessalon  Perles for daytime cough. - Prescribe hydrocodone  cough syrup for nighttime use.  Iron deficiency anemia Iron deficiency managed with supplementation. - Order lab tests to check iron levels in 2 months  Vitamin D  deficiency Vitamin D  deficiency managed with supplementation. - Order lab tests to check vitamin D  levels.  Vitamin B12 deficiency Vitamin B12 deficiency under monitoring. - Order lab tests to check vitamin B12 levels.  Thyroid disorder Thyroid disorder under  monitoring. - Order lab tests to check thyroid function.      OOW note from Wed-Saturday. RTW Monday    No follow-ups on file.   Subjective:    Patient ID: Christine Mercer, female    DOB: [birth year]  Age: 33 y.o. MRN: [id number]  Chief Complaint  Patient presents with   Sinus Problem    Patient comes in today because she feels that her head is stopped up, ears feel stopped up as well, with a sore throat due to coughing and drainage.     Sinus Problem   Discussed the use of AI scribe software for clinical note transcription with the patient, who gave verbal consent to proceed.  History of Present Illness        Christine Mercer is a 33 year old female who presents with sinus congestion and fatigue for almost one week.  She has been experiencing sinus congestion with pressure over the frontal area, ear fullness and popping, and a persistent cough, especially at night. She wakes up gagging and sometimes vomiting due to mucus accumulation. No fever, but she has body aches and fatigue, which began approximately 4-5 days ago. her kids have been sick. patient did not test for COVID at home.   She has been using Mucinex, Nyquil, DayQuil, and cough drops to manage her symptoms. No wheezing or shortness of breath. She has not used Flonase or Nasonex recently but has them at home from previous episodes of bad allergies. She reports that the only medications  she is currently taking are iron and vitamin D  supplements and thyroid medication. No allergies to antibiotics and no issues with amoxicillin  or Augmentin. She has previously used prednisone  for poison ivy without adverse effects.  She feels mentally exhausted and stressed due to work demands, which is compounded by her current illness.  Physical Exam HEENT: Cerumen right ear and fluid present in ears. Frontal sinus pressure. CHEST: Lungs clear to auscultation bilaterally. Results Assessment & Plan Acute sinusitis with upper respiratory  symptoms Acute sinusitis with sinus pressure, nasal congestion, and post-nasal drainage. Differential includes possible COVID-19, but no fever or severe respiratory symptoms. Lungs clear. - prescription of amoxicillin  for sinusitis. - Prescribe prednisone  for sinus pressure and congestion, discussed potential hyperactivity and sleep disturbances. - Recommend Flonase for nasal congestion. - Prescribe Tessalon  Perles for daytime cough. - Prescribe hydrocodone  cough syrup for nighttime use.  Iron deficiency anemia Iron deficiency managed with supplementation. - Order lab tests to check iron levels in 2 months  Vitamin D  deficiency Vitamin D  deficiency managed with supplementation. - Order lab tests to check vitamin D  levels.  Vitamin B12 deficiency Vitamin B12 deficiency under monitoring. - Order lab tests to check vitamin B12 levels.  Thyroid disorder Thyroid disorder under monitoring. - Order lab tests to check thyroid function.    The ASCVD Risk score (Arnett DK, et al., 2019) failed to calculate for the following reasons:   The 2019 ASCVD risk score is only valid for ages 64 to 97  Past Medical History:  Diagnosis Date   Hypothyroidism    Normal pregnancy in multigravida in third trimester 02/11/2016   Normal pregnancy, first 07/25/2011   S/P tubal ligation 03/10/2016   SVD (spontaneous vaginal delivery) 07/25/2011   Thyroid disease    hypothyroid   Past Surgical History:  Procedure Laterality Date   LAPAROSCOPIC TUBAL LIGATION Bilateral 03/10/2016   Procedure: LAPAROSCOPIC TUBAL LIGATION;  Surgeon: Ezzie Buba, MD;  Location: [facility];  Service: Gynecology;  Laterality: Bilateral;   Social History   Tobacco Use   Smoking status: Never   Smokeless tobacco: Never  Substance Use Topics   Alcohol use: No    Alcohol/week: 0.0 standard drinks of alcohol   Drug use: No   Family History  Problem Relation Age of Onset   Diabetes Mother    Hypertension Mother     Hypertension Father    Hypertension Brother    No Known Allergies  ROS    Objective:    BP 138/74   Pulse 68   Temp 97.9 F (36.6 C)   Ht 5' 6 (1.676 m)   Wt 209 lb 9.6 oz (95.1 kg)   SpO2 98%   BMI 33.83 kg/m  BP Readings from Last 3 Encounters:  01/05/24 138/74  11/18/23 124/82  03/12/21 135/87   Wt Readings from Last 3 Encounters:  01/05/24 209 lb 9.6 oz (95.1 kg)  11/18/23 213 lb 2 oz (96.7 kg)  03/06/16 207 lb 8 oz (94.1 kg)    Physical Exam Vitals and nursing note reviewed.  Constitutional:      General: She is not in acute distress.    Appearance: Normal appearance.     Comments: Appears fatigued  HENT:     Head: Normocephalic.     Right Ear: Tympanic membrane, ear canal and external ear normal.     Left Ear: Tympanic membrane, ear canal and external ear normal.     Nose: Congestion present.     Mouth/Throat:  Pharynx: No oropharyngeal exudate or posterior oropharyngeal erythema.     Comments: Postnasal drip Eyes:     Extraocular Movements: Extraocular movements intact.     Pupils: Pupils are equal, round, and reactive to light.  Cardiovascular:     Rate and Rhythm: Normal rate and regular rhythm.     Heart sounds: Normal heart sounds.  Pulmonary:     Effort: Pulmonary effort is normal.     Breath sounds: Normal breath sounds. No wheezing.  Musculoskeletal:     Right lower leg: No edema.     Left lower leg: No edema.  Neurological:     General: No focal deficit present.     Mental Status: She is alert and oriented to person, place, and time.  Psychiatric:        Mood and Affect: Mood normal.        Behavior: Behavior normal.      No results found for any visits on 01/05/24.

## 2024-05-05 ENCOUNTER — Encounter: Payer: Self-pay | Admitting: Family Medicine

## 2024-05-06 ENCOUNTER — Telehealth (INDEPENDENT_AMBULATORY_CARE_PROVIDER_SITE_OTHER): Admitting: Family Medicine

## 2024-05-06 DIAGNOSIS — M545 Low back pain, unspecified: Secondary | ICD-10-CM

## 2024-05-06 DIAGNOSIS — M62830 Muscle spasm of back: Secondary | ICD-10-CM

## 2024-05-06 MED ORDER — METHOCARBAMOL 500 MG PO TABS: 500.0000 mg | ORAL_TABLET | Freq: Three times a day (TID) | ORAL | 1 refills | Status: AC | PRN

## 2024-05-06 MED ORDER — MELOXICAM 15 MG PO TABS: 15.0000 mg | ORAL_TABLET | Freq: Every day | ORAL | 0 refills | Status: AC

## 2024-05-06 MED ORDER — CYCLOBENZAPRINE HCL 5 MG PO TABS: 5.0000 mg | ORAL_TABLET | Freq: Three times a day (TID) | ORAL | 0 refills | Status: AC | PRN

## 2024-05-06 NOTE — Progress Notes (Signed)
 "  Virtual Visit via Video Note  Assessment and Plan: Acute midline low back pain without sciatica - Plan: methocarbamol  (ROBAXIN ) 500 MG tablet, cyclobenzaprine  (FLEXERIL ) 5 MG tablet, meloxicam  (MOBIC ) 15 MG tablet  Spasm of muscle of lower back - Plan: methocarbamol  (ROBAXIN ) 500 MG tablet, cyclobenzaprine  (FLEXERIL ) 5 MG tablet, meloxicam  (MOBIC ) 15 MG tablet  Follow Up Instructions: Will let us  know if not improving Return if symptoms worsen or fail to improve.   I discussed the assessment and treatment plan with the patient. The patient was provided an opportunity to ask questions, and all were answered. The patient agreed with the plan and demonstrated an understanding of the instructions.   The patient was advised to call back or seek an in-person evaluation if the symptoms worsen or if the condition fails to improve as anticipated.  The above assessment and management plan was discussed with the patient. The patient verbalized understanding of and has agreed to the management plan.   Connie Emperor, MD  I connected with Christine Mercer on 05/06/2024 at  8:00 AM EST by a video enabled telemedicine application and verified that I am speaking with the correct person using two identifiers.  Patient Location: Home Provider Location: Home Office  I discussed the limitations, risks, security, and privacy concerns of performing an evaluation and management service by video and the availability of in person appointments. I also discussed with the patient that there may be a patient responsible charge related to this service. The patient expressed understanding and agreed to proceed.  Subjective: PCP: Emperor Connie, MD  No chief complaint on file.   History of Present Illness Christine Mercer is a 33 year old female who presents with acute lower back pain.  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 The pain is  described as a pulling sensation in the lower back, which has been tender since childbirth. It is exacerbated by driving, sitting, and certain movements, making daily activities challenging.  Muscle spasms occur, particularly when sitting, and she has difficulty with tasks such as wiping after using the toilet. Her pain level is currently rated at six or seven out of ten. She has been using ibuprofen  400 mg and an unspecified muscle relaxant, possibly Flexeril  or Robaxin , to manage the symptoms. Despite this, the pain remains significant, affecting her ability to work and sleep.  She was unable to sleep well last night due to restlessness and discomfort. 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  She experiences occasional numbness and pain in her right leg, extending to the thigh but not beyond, which resolves after a few minutes. No urinary incontinence is reported.  Her workload has increased significantly due to the holiday season, with packages tripling in volume. She has good footwear for her job, which involves a lot of movement.  Assessment and Plan Acute low back pain with muscle spasm Severe low back pain with muscle spasms, no fractures or significant neurological deficits. Walking provides relief. X-ray not indicated unless symptoms persist or worsen. - Prescribed Flexeril  for nighttime use. - Prescribed Robaxin  for daytime muscle spasm management. - Prescribed meloxicam  with food as an anti-inflammatory. - Provided work note for absence today and tomorrow, return Monday. - Advised against prolonged bed rest; encouraged movement and stretching. - Instructed to monitor symptoms and consider x-ray if symptoms persist or worsen.   ROS: Per HPI Current  Medications[1]  Social History   Socioeconomic History   Marital status: Single    Spouse name: Not on file   Number of children: Not on file   Years of education:  Not on file   Highest education level: Some college, no degree  Occupational History   Not on file  Tobacco Use   Smoking status: Never   Smokeless tobacco: Never  Substance and Sexual Activity   Alcohol use: No    Alcohol/week: 0.0 standard drinks of alcohol   Drug use: No   Sexual activity: Yes    Birth control/protection: None  Other Topics Concern   Not on file  Social History Narrative   Not on file   Social Drivers of Health   Tobacco Use: Low Risk (05/06/2024)   Patient History    Smoking Tobacco Use: Never    Smokeless Tobacco Use: Never    Passive Exposure: Not on file  Financial Resource Strain: Low Risk (05/06/2024)   Overall Financial Resource Strain (CARDIA)    Difficulty of Paying Living Expenses: Not hard at all  Food Insecurity: No Food Insecurity (05/06/2024)   Epic    Worried About Radiation Protection Practitioner of Food in the Last Year: Never true    Ran Out of Food in the Last Year: Never true  Transportation Needs: No Transportation Needs (05/06/2024)   Epic    Lack of Transportation (Medical): No    Lack of Transportation (Non-Medical): No  Physical Activity: Sufficiently Active (05/06/2024)   Exercise Vital Sign    Days of Exercise per Week: 5 days    Minutes of Exercise per Session: 60 min  Stress: No Stress Concern Present (05/06/2024)   [employer] of Occupational Health - Occupational Stress Questionnaire    Feeling of Stress: Only a little  Social Connections: Moderately Isolated (05/06/2024)   Social Connection and Isolation Panel    Frequency of Communication with Friends and Family: More than three times a week    Frequency of Social Gatherings with Friends and Family: Once a week    Attends Religious Services: Never    Database Administrator or Organizations: Yes    Attends Engineer, Structural: More than 4 times per year    Marital Status: Never married  Intimate Partner Violence: Unknown (08/20/2021)   Received from [facility]   HITS     Physically Hurt: Not on file    Insult or Talk Down To: Not on file    Threaten Physical Harm: Not on file    Scream or Curse: Not on file  Depression (PHQ2-9): Low Risk (11/18/2023)   Depression (PHQ2-9)    PHQ-2 Score: 1  Alcohol Screen: Low Risk (05/06/2024)   Alcohol Screen    Last Alcohol Screening Score (AUDIT): 4  Housing: Unknown (05/06/2024)   Epic    Unable to Pay for Housing in the Last Year: No    Number of Times Moved in the Last Year: Not on file    Homeless in the Last Year: No  Utilities: Low Risk (01/06/2023)   Received from [facility]   Utilities    In the past 12 months has the electric, gas, oil, or water company threatened to shut off services in your home? : No  Health Literacy: Not on file     Observations/Objective: There were no vitals filed for this visit. Physical Exam Constitutional:      General: She is not in acute distress. HENT:     Head:  Normocephalic.  Neurological:     Mental Status: She is oriented to person, place, and time.     Comments: Patient is sitting during telemedicine  Psychiatric:        Mood and Affect: Mood normal.        Behavior: Behavior normal.        [1]  Current Outpatient Medications:    cyclobenzaprine  (FLEXERIL ) 5 MG tablet, Take 1 tablet (5 mg total) by mouth 3 (three) times daily as needed for muscle spasms. Or take night time (causes sedation), Disp: 30 tablet, Rfl: 0   meloxicam  (MOBIC ) 15 MG tablet, Take 1 tablet (15 mg total) by mouth daily. Take with food, Disp: 30 tablet, Rfl: 0   methocarbamol  (ROBAXIN ) 500 MG tablet, Take 1 tablet (500 mg total) by mouth every 8 (eight) hours as needed. Take during the day, Disp: 30 tablet, Rfl: 1   benzonatate  (TESSALON  PERLES) 100 MG capsule, Take 1 capsule (100 mg total) by mouth 3 (three) times daily as needed., Disp: 30 capsule, Rfl: 0   HYDROcodone  bit-homatropine (HYCODAN) 5-1.5 MG/5ML syrup, Take 5 mLs by mouth every 8 (eight) hours as needed for cough. Or  take at night time as needed for cough, Disp: 120 mL, Rfl: 0   levothyroxine  (SYNTHROID ) 25 MCG tablet, Take 1 tablet (25 mcg total) by mouth daily., Disp: 90 tablet, Rfl: 3   liothyronine  (CYTOMEL ) 5 MCG tablet, Take 5 mcg by mouth daily before breakfast., Disp: , Rfl:    predniSONE  (STERAPRED UNI-PAK 21 TAB) 10 MG (21) TBPK tablet, Use as directed., Disp: 21 each, Rfl: 0  "
